# Patient Record
Sex: Male | Born: 1965 | Race: Black or African American | Hispanic: No | Marital: Single | Smoking: Never smoker
Health system: Southern US, Community
[De-identification: ages and names within clinical notes are randomized; demographics above are authoritative.]

## PROBLEM LIST (undated history)

## (undated) DIAGNOSIS — E785 Hyperlipidemia, unspecified: Secondary | ICD-10-CM

## (undated) DIAGNOSIS — K219 Gastro-esophageal reflux disease without esophagitis: Secondary | ICD-10-CM

## (undated) DIAGNOSIS — I10 Essential (primary) hypertension: Secondary | ICD-10-CM

## (undated) HISTORY — DX: Hyperlipidemia, unspecified: E78.5

## (undated) HISTORY — DX: Essential (primary) hypertension: I10

---

## 2005-10-16 ENCOUNTER — Encounter: Admission: RE | Admit: 2005-10-16 | Discharge: 2005-10-16 | Payer: Self-pay | Admitting: Specialist

## 2007-12-21 ENCOUNTER — Emergency Department (HOSPITAL_COMMUNITY): Admission: EM | Admit: 2007-12-21 | Discharge: 2007-12-22 | Payer: Self-pay | Admitting: Emergency Medicine

## 2009-03-27 ENCOUNTER — Encounter: Admission: RE | Admit: 2009-03-27 | Discharge: 2009-03-27 | Payer: Self-pay | Admitting: Family Medicine

## 2009-04-14 ENCOUNTER — Ambulatory Visit (HOSPITAL_COMMUNITY): Admission: RE | Admit: 2009-04-14 | Discharge: 2009-04-14 | Payer: Self-pay | Admitting: Emergency Medicine

## 2010-10-10 ENCOUNTER — Emergency Department (HOSPITAL_COMMUNITY)
Admission: EM | Admit: 2010-10-10 | Discharge: 2010-10-10 | Payer: Self-pay | Source: Home / Self Care | Admitting: Emergency Medicine

## 2010-10-13 LAB — BASIC METABOLIC PANEL
BUN: 14 mg/dL (ref 6–23)
CO2: 26 mEq/L (ref 19–32)
Calcium: 9.8 mg/dL (ref 8.4–10.5)
Chloride: 106 mEq/L (ref 96–112)
Creatinine, Ser: 0.94 mg/dL (ref 0.4–1.5)
GFR calc Af Amer: >60 mL/min (ref >60)
GFR calc non Af Amer: >60 mL/min (ref >60)
Glucose, Bld: 131 mg/dL — ABNORMAL HIGH (ref 70–99)
Potassium: 3.6 mEq/L (ref 3.5–5.1)
Sodium: 140 mEq/L (ref 135–145)

## 2010-10-13 LAB — CBC
HCT: 40.5 % (ref 39.0–52.0)
Hemoglobin: 13.5 g/dL (ref 13.0–17.0)
MCH: 22.4 pg — ABNORMAL LOW (ref 26.0–34.0)
MCHC: 33.3 g/dL (ref 30.0–36.0)
MCV: 67.3 fL — ABNORMAL LOW (ref 78.0–100.0)
Platelets: 134 10*3/uL — ABNORMAL LOW (ref 150–400)
RBC: 6.02 MIL/uL — ABNORMAL HIGH (ref 4.22–5.81)
RDW: 15.8 % — ABNORMAL HIGH (ref 11.5–15.5)
WBC: 5.6 10*3/uL (ref 4.0–10.5)

## 2010-10-13 LAB — D-DIMER, QUANTITATIVE: D-Dimer, Quant: 0.41 ug/mL-FEU (ref 0.00–0.48)

## 2010-12-05 ENCOUNTER — Other Ambulatory Visit: Payer: Self-pay | Admitting: Family Medicine

## 2010-12-05 DIAGNOSIS — E049 Nontoxic goiter, unspecified: Secondary | ICD-10-CM

## 2010-12-10 ENCOUNTER — Other Ambulatory Visit: Payer: Self-pay

## 2010-12-15 ENCOUNTER — Other Ambulatory Visit (HOSPITAL_COMMUNITY): Payer: Self-pay | Admitting: Endocrinology

## 2010-12-15 DIAGNOSIS — E0590 Thyrotoxicosis, unspecified without thyrotoxic crisis or storm: Secondary | ICD-10-CM

## 2010-12-25 ENCOUNTER — Ambulatory Visit (HOSPITAL_COMMUNITY): Payer: Self-pay

## 2010-12-26 ENCOUNTER — Other Ambulatory Visit (HOSPITAL_COMMUNITY): Payer: Self-pay

## 2011-01-05 ENCOUNTER — Ambulatory Visit (HOSPITAL_COMMUNITY): Payer: Self-pay

## 2011-01-06 ENCOUNTER — Other Ambulatory Visit (HOSPITAL_COMMUNITY): Payer: Self-pay

## 2011-01-08 ENCOUNTER — Ambulatory Visit (HOSPITAL_COMMUNITY): Payer: Self-pay

## 2011-01-09 ENCOUNTER — Other Ambulatory Visit (HOSPITAL_COMMUNITY): Payer: Self-pay

## 2011-06-22 LAB — RAPID STREP SCREEN (MED CTR MEBANE ONLY): Streptococcus, Group A Screen (Direct): NEGATIVE

## 2011-09-13 ENCOUNTER — Ambulatory Visit (INDEPENDENT_AMBULATORY_CARE_PROVIDER_SITE_OTHER): Payer: BC Managed Care – PPO

## 2011-09-13 DIAGNOSIS — R071 Chest pain on breathing: Secondary | ICD-10-CM

## 2011-09-17 ENCOUNTER — Ambulatory Visit (INDEPENDENT_AMBULATORY_CARE_PROVIDER_SITE_OTHER): Payer: BC Managed Care – PPO

## 2011-09-17 DIAGNOSIS — J069 Acute upper respiratory infection, unspecified: Secondary | ICD-10-CM

## 2011-09-17 DIAGNOSIS — R05 Cough: Secondary | ICD-10-CM

## 2011-12-15 ENCOUNTER — Ambulatory Visit (INDEPENDENT_AMBULATORY_CARE_PROVIDER_SITE_OTHER): Payer: BC Managed Care – PPO | Admitting: Family Medicine

## 2011-12-15 VITALS — BP 139/90 | HR 67 | Temp 98.5°F | Resp 18 | Ht 72.0 in | Wt 194.0 lb

## 2011-12-15 DIAGNOSIS — K219 Gastro-esophageal reflux disease without esophagitis: Secondary | ICD-10-CM | POA: Insufficient documentation

## 2011-12-15 DIAGNOSIS — J029 Acute pharyngitis, unspecified: Secondary | ICD-10-CM

## 2011-12-15 LAB — POCT RAPID STREP A (OFFICE): Rapid Strep A Screen: NEGATIVE

## 2011-12-15 MED ORDER — IPRATROPIUM BROMIDE 0.03 % NA SOLN: 2.0000 | NASAL | Status: DC | PRN

## 2011-12-15 MED ORDER — AMOXICILLIN 875 MG PO TABS: 875.0000 mg | ORAL_TABLET | Freq: Two times a day (BID) | ORAL | Status: AC

## 2011-12-15 MED ORDER — MAGIC MOUTHWASH W/LIDOCAINE: 5.0000 mL | Freq: Four times a day (QID) | ORAL | Status: DC | PRN

## 2011-12-15 MED ORDER — ESOMEPRAZOLE MAGNESIUM 20 MG PO PACK: 40.0000 mg | PACK | Freq: Every day | ORAL | Status: DC

## 2011-12-15 NOTE — Patient Instructions (Signed)

## 2011-12-15 NOTE — Progress Notes (Signed)
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  O:  NAD Throat: red, no exudates TM's:  Retracted, dull right ear Chest:  Clear Neck:  Tender right submandibular node Results for orders placed in visit on 12/15/11  POCT RAPID STREP A (OFFICE)      Component Value Range   Rapid Strep A Screen Negative  Negative   A: otitis media P:  Amox, mmw

## 2011-12-17 ENCOUNTER — Emergency Department (HOSPITAL_COMMUNITY)
Admission: EM | Admit: 2011-12-17 | Discharge: 2011-12-17 | Disposition: A | Discharge status: Home / Self Care | Payer: BC Managed Care – PPO | Attending: Emergency Medicine | Admitting: Emergency Medicine

## 2011-12-17 ENCOUNTER — Encounter (HOSPITAL_COMMUNITY): Payer: Self-pay

## 2011-12-17 ENCOUNTER — Emergency Department (HOSPITAL_COMMUNITY): Payer: BC Managed Care – PPO

## 2011-12-17 DIAGNOSIS — R070 Pain in throat: Secondary | ICD-10-CM | POA: Insufficient documentation

## 2011-12-17 DIAGNOSIS — Z79899 Other long term (current) drug therapy: Secondary | ICD-10-CM | POA: Insufficient documentation

## 2011-12-17 DIAGNOSIS — R599 Enlarged lymph nodes, unspecified: Secondary | ICD-10-CM | POA: Insufficient documentation

## 2011-12-17 DIAGNOSIS — J029 Acute pharyngitis, unspecified: Secondary | ICD-10-CM

## 2011-12-17 MED ORDER — LIDOCAINE VISCOUS 2 % MT SOLN: 20.0000 mL | OROMUCOSAL | Status: AC | PRN

## 2011-12-17 NOTE — Discharge Instructions (Signed)

## 2011-12-17 NOTE — ED Notes (Signed)
Pt states that it feels like something is stuck in his throat, pt moving air and able to speak

## 2011-12-17 NOTE — ED Provider Notes (Signed)
Medical screening examination/treatment/procedure(s) were performed by non-physician practitioner and as supervising physician I was immediately available for consultation/collaboration.   Lyanne Co, MD 12/17/11 [phone]

## 2011-12-17 NOTE — ED Provider Notes (Signed)
History     CSN: [id number]  Arrival date & time 12/17/11  0610   First MD Initiated Contact with Patient 12/17/11 [phone]      Chief Complaint  Patient presents with  . Foreign Body    (Consider location/radiation/quality/duration/timing/severity/associated sxs/prior treatment) HPI  46 year old male presents with chief complaints of dysphagia. Patient states for the past couple days he has been having discomfort to his throat. Initially he experiencing right ear pain, with associated sneezing, and mild nasal congestion. Ear pain lasting 1 day and has improved.  However, patient states pain continues to travel to throat, with increase difficulty swallowing.  Sxs worsen yesterday but does improve.  He is here because he worries pain may travel down to his neck and caused difficulty breathing.  Denies SOB at this time.  Denies fever, headache, cp, sob, n/v/d, throat swelling.  Denies any medication changes, exposure to foreign body, or having recent trauma.    History reviewed. No pertinent past medical history.  History reviewed. No pertinent past surgical history.  No family history on file.  History  Substance Use Topics  . Smoking status: Never Smoker   . Smokeless tobacco: Not on file  . Alcohol Use: No      Review of Systems  All other systems reviewed and are negative.    Allergies  Chloroquine  Home Medications   Current Outpatient Rx  Name Route Sig Dispense Refill  . MAGIC MOUTHWASH W/LIDOCAINE Oral Take 5 mLs by mouth 4 (four) times daily as needed. 100 mL 0  . AMOXICILLIN 875 MG PO TABS Oral Take 1 tablet (875 mg total) by mouth 2 (two) times daily. 20 tablet 0  . ASPIRIN 81 MG PO TABS Oral Take 81 mg by mouth as needed.    Marland Kitchen ESOMEPRAZOLE MAGNESIUM 20 MG PO PACK Oral Take 40 mg by mouth daily. 30 each 11  . IPRATROPIUM BROMIDE 0.03 % NA SOLN Nasal Place 2 sprays into the nose as needed. 30 mL 11    BP 147/85  Pulse 89  Temp(Src) 98.7 F (37.1 C) (Oral)   Resp 20  SpO2 100%  Physical Exam  Nursing note and vitals reviewed. Constitutional: He appears well-developed and well-nourished. No distress.       Awake, alert, nontoxic appearance  HENT:  Head: Atraumatic.  Right Ear: Hearing and tympanic membrane normal.  Left Ear: Hearing and tympanic membrane normal.  Nose: Nose normal.  Mouth/Throat: Uvula is midline, oropharynx is clear and moist and mucous membranes are normal. No oral lesions. No oropharyngeal exudate, posterior oropharyngeal edema, posterior oropharyngeal erythema or tonsillar abscesses.  Eyes: Conjunctivae are normal. Right eye exhibits no discharge. Left eye exhibits no discharge.  Neck: Normal range of motion and phonation normal. Neck supple. Tracheal tenderness present. No tracheal deviation present. No mass present.  Cardiovascular: Normal rate and regular rhythm.   Pulmonary/Chest: Effort normal. No respiratory distress. He exhibits no tenderness.  Abdominal: Soft. There is no tenderness. There is no rebound.  Musculoskeletal: He exhibits no tenderness.       ROM appears intact, no obvious focal weakness  Lymphadenopathy:    He has cervical adenopathy.  Neurological: He is alert.  Skin: Skin is warm and dry. No rash noted.  Psychiatric: He has a normal mood and affect.    ED Course  Procedures (including critical care time)  Labs Reviewed - No data to display No results found.   No diagnosis found.  Results for orders placed in visit on  12/15/11  POCT RAPID STREP A (OFFICE)      Component Value Range   Rapid Strep A Screen Negative  Negative    Dg Neck Soft Tissue  12/17/2011  *RADIOLOGY REPORT*  Clinical Data: Feeling of something stuck in the throat, difficulty swallowing  NECK SOFT TISSUES - 1+ VIEW  Comparison: None.  Findings: The hypopharynx appears normal.  No opaque foreign body is seen.  The epiglottis is normal in size.  No retropharyngeal soft tissue swelling is seen.  The cervical vertebrae are  in normal alignment.  IMPRESSION: Negative lateral soft tissue view of the neck.  Original Report Authenticated By: Juline Patch, M.D.       MDM  Patient with throat pain.  He worries about inability to swallow. He is however able to tolerate by mouth without difficulty. Oral examination reveals no acute finding. He does have mild anterior cervical lymphadenopathy that is tender on palpation. No evidence of abscess or overlying skin changes. No evidence of trismus. No voice changes. He presents with URI symptoms. He requests for x-ray for further evaluation. I will order neck x-ray here in ED.  Pt also has been prescribed amox since yesterday. Doubt allergic rxn to amox as sxs occurs prior to abx.    9:13 AM Next soft tissue x-ray reveals no acute finding. Reassurance given. Discussed red flag signs with strict follow up instruction.  Will prescribed viscous lidocaine for comfort, and also ENT referral.  Pt voice understanding and agrees with plan.         Fayrene Helper, PA-C 12/17/11 [id number]  Fayrene Helper, PA-C 12/17/11 [phone]

## 2012-01-16 ENCOUNTER — Ambulatory Visit (INDEPENDENT_AMBULATORY_CARE_PROVIDER_SITE_OTHER): Payer: BC Managed Care – PPO | Admitting: Family Medicine

## 2012-01-16 ENCOUNTER — Other Ambulatory Visit: Payer: Self-pay | Admitting: Family Medicine

## 2012-01-16 DIAGNOSIS — R7989 Other specified abnormal findings of blood chemistry: Secondary | ICD-10-CM

## 2012-01-16 DIAGNOSIS — R079 Chest pain, unspecified: Secondary | ICD-10-CM

## 2012-01-16 DIAGNOSIS — E7800 Pure hypercholesterolemia, unspecified: Secondary | ICD-10-CM

## 2012-01-16 LAB — LIPID PANEL
Cholesterol: 247 mg/dL — ABNORMAL HIGH (ref 0–200)
HDL: 66 mg/dL (ref >39)
LDL Cholesterol: 173 mg/dL — ABNORMAL HIGH (ref 0–99)
Total CHOL/HDL Ratio: 3.7 Ratio
Triglycerides: 38 mg/dL (ref <150)
VLDL: 8 mg/dL (ref 0–40)

## 2012-01-16 LAB — TSH: TSH: 0.115 u[IU]/mL — ABNORMAL LOW (ref 0.350–4.500)

## 2012-01-16 NOTE — Patient Instructions (Signed)
Your cardiologist Dr. Yates [phone]

## 2012-01-16 NOTE — Progress Notes (Signed)
Patient Name: Donald Wyatt Date of Birth: [birth year] Medical Record Number: [id number] Gender: male Date of Encounter: 01/16/2012  History of Present Illness:  Donald Wyatt is a 46 y.o. very pleasant male patient who presents with the following:  Here to discuss recurrent CP.  He has had left sided CP for about the past week.  He has been taking one baby asa daily and notes that after he takes this medication he feels better.  However last night he noted a "sharp" CP in his left side/ chest/ back.  No SOB.  Donald Wyatt has been seen a few times for similar CP and actually has seen cardiology and had a negative ETT about one year ago.  He is not feeling the pain now.  He took a baby asa this morning around 2 am which relieved the pain.   The chest pain lasts for 30- 40 seconds before resolving.  It occurs usually once a day.  It usually occurs during the day but also occurred last night.  He is usually sitting or walking when the pain occurs.   Looking back through our chart Donald Wyatt has been seen at least 8 times for CP over the last couple of years.    See negative ETT 12/10/2010   Donald Wyatt also notes that he suffers from GERD and eats only once a day- he usually eats "just one big meal" daily in the afternoon.   Patient Active Problem List  Diagnoses  . GERD (gastroesophageal reflux disease)   No past medical history on file. No past surgical history on file. History  Substance Use Topics  . Smoking status: Never Smoker   . Smokeless tobacco: Not on file  . Alcohol Use: No   No family history on file. Allergies  Allergen Reactions  . Chloroquine Itching    Medication list has been reviewed and updated.  Review of Systems: As per HPI- otherwise negative.   Physical Examination: Filed Vitals:   01/16/12 0924  BP: 132/85  Pulse: 76  Temp: 98.8 F (37.1 C)  TempSrc: Oral  Resp: 16  Height: 6' (1.829 m)  Weight: 194 lb (87.998 kg)   O2 sat 98% Body mass index is 26.31  kg/(m^2).  GEN: WDWN, NAD, Non-toxic, A & O x 3 HEENT: Atraumatic, Normocephalic. Neck supple. No masses, No LAD.  TM, oropharynx wnl Ears and Nose: No external deformity. CV: RRR, No M/G/R. No JVD. No thrill. No extra heart sounds. PULM: CTA B, no wheezes, crackles, rhonchi. No retractions. No resp. distress. No accessory muscle use. ABD: S, NT, ND, +BS. No rebound. No HSM. EXTR: No c/c/e NEURO Normal gait.  PSYCH: Normally interactive. Conversant. Not depressed or anxious appearing.  Calm demeanor.   EKG: normal sinus rhythm, there is a small downward going T in III but this is not new.  Assessment and Plan: 1. Chest pain  EKG 12-Lead  2. High cholesterol  Lipid panel  3. Abnormal TSH  TSH   Donald Wyatt is here to evaluate very atypical CP- he has been in to clinic several times for this and he tends to be very anxious about his CP.  Tried to reassure him that a normal stress test is reassuring for several years after the test.  Will check his lipids again, and TSH.  Reassured him that I do not think that his pain is cardiac.  Offered a recheck with cardiology and gave him Dr. Verl Dicker number- he can recheck at his office if he is still  worried or if his pain persists or worsens.  If he has typical pain (occurs with exercise, lasts several minutes, is associated with sweating or nausea) seek care at ED

## 2012-01-18 LAB — T3: T3, Total: 102.9 ng/dL (ref 80.0–204.0)

## 2012-01-18 LAB — T4, FREE: Free T4: 1.15 ng/dL (ref 0.80–1.80)

## 2012-01-20 ENCOUNTER — Telehealth: Payer: Self-pay

## 2012-01-20 DIAGNOSIS — E7800 Pure hypercholesterolemia, unspecified: Secondary | ICD-10-CM

## 2012-01-20 NOTE — Telephone Encounter (Signed)
Pt is returning clinical nurse phone call he can be reached at [phone]

## 2012-01-21 ENCOUNTER — Encounter: Payer: Self-pay | Admitting: Family Medicine

## 2012-01-21 NOTE — Progress Notes (Signed)
Addended by: Abbe Amsterdam C on: 01/21/2012 02:06 PM   Modules accepted: Orders

## 2012-01-23 NOTE — Telephone Encounter (Signed)
Pt notified of labs. Would like to try chol med if you can send it to [facility] on [address]. Also pt is no longer having chest complaints.

## 2012-01-24 MED ORDER — PRAVASTATIN SODIUM 40 MG PO TABS: 40.0000 mg | ORAL_TABLET | Freq: Every day | ORAL | Status: DC

## 2012-02-21 ENCOUNTER — Telehealth: Payer: Self-pay

## 2012-02-21 NOTE — Telephone Encounter (Signed)
Pt called stated he left paper work over 5 days  To be filled out. Please call pt when ready to  Pick  Up. Needed before June 3rd. Home # [id number] Cell # [phone]

## 2012-02-22 ENCOUNTER — Telehealth: Payer: Self-pay | Admitting: Family Medicine

## 2012-02-22 NOTE — Telephone Encounter (Signed)
LMOM advising patient to RTC for exam.

## 2012-02-22 NOTE — Telephone Encounter (Signed)
Message copied by Jacqualyn Posey on Mon Feb 22, 2012 10:39 AM ------      Message from: Abbe Amsterdam C      Created: Wed Feb 17, 2012  8:39 PM       Received notes for a physical exam form.  I did not do a CPE in April- we actually saw him for chest pain!  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

## 2012-02-22 NOTE — Telephone Encounter (Signed)
See other message, patient needs OV per Dr. Patsy Lager.  Called patient and LMOM advising this.

## 2012-02-23 NOTE — Telephone Encounter (Signed)
PE was October 4th, 2012, which is almost 8 months ago.  I will fill out form based on that PE if that is allowable by the entity needing the exam (i.e. It may state the exam needed to be completed within last 6 months).  If there is no such restriction, I will fill it out.  But bring it soon before I leave!

## 2012-02-23 NOTE — Telephone Encounter (Signed)
Pt CB and was confused and wanted [place] to check on when last CPE was done. He knows that he didn't have PE in April, but thought it was w/in the last 6 mos and wondered if PPW could be filled out from that PE? Dr Georgiana Shore, can you do this from that PE, or does pt need to RTC for an OV (but not a CPE since his ins won't cover yet)? Chart is in your box MR [id number]

## 2012-02-25 NOTE — Telephone Encounter (Signed)
LMOM on Cell # to CB. Tried his H # and child answered and said he was not there. I did not LM w/child

## 2012-02-26 ENCOUNTER — Telehealth: Payer: Self-pay

## 2012-02-26 NOTE — Telephone Encounter (Signed)
Pt called in to stay that it was ok to use pe from ago he is needing this info before Monday, please contact pt when ready for pick up.

## 2012-02-26 NOTE — Telephone Encounter (Signed)
LMOM that Dr Georgiana Shore stated that she will fill out form using info from PE 8 mos ago if his employer allows it to be from that long ago. Advised that pt bring form by ASAP since Dr Georgiana Shore is moving very soon.

## 2012-02-27 NOTE — Telephone Encounter (Signed)
Their are notes in message box from 5-27 thur 6-31 pt has been trying to get ppwork filled out for court purpose or for employment he is very upset and states he will come in tomorrow and stay till he gets his paperwork.. Please contact pt when ppw is ready to be picked-up

## 2012-02-28 ENCOUNTER — Ambulatory Visit (INDEPENDENT_AMBULATORY_CARE_PROVIDER_SITE_OTHER): Payer: Self-pay | Admitting: Internal Medicine

## 2012-02-28 VITALS — BP 118/75 | HR 68 | Temp 98.1°F | Resp 16 | Ht 71.5 in | Wt 191.0 lb

## 2012-02-28 DIAGNOSIS — Z0289 Encounter for other administrative examinations: Secondary | ICD-10-CM

## 2012-02-28 DIAGNOSIS — Z029 Encounter for administrative examinations, unspecified: Secondary | ICD-10-CM

## 2012-02-28 NOTE — Telephone Encounter (Signed)
Pt came in today to pickup PE form. We could not locate form. Pt checked in to be seen to have PE done today as he has the test at 7:30 am Monday.

## 2012-02-28 NOTE — Progress Notes (Signed)
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

## 2012-03-25 ENCOUNTER — Ambulatory Visit (INDEPENDENT_AMBULATORY_CARE_PROVIDER_SITE_OTHER): Payer: BC Managed Care – PPO | Admitting: Family Medicine

## 2012-03-25 ENCOUNTER — Other Ambulatory Visit: Payer: Self-pay | Admitting: Family Medicine

## 2012-03-25 ENCOUNTER — Encounter: Payer: Self-pay | Admitting: Family Medicine

## 2012-03-25 VITALS — BP 123/83 | HR 63 | Temp 98.2°F | Resp 18 | Ht 71.5 in | Wt 195.0 lb

## 2012-03-25 DIAGNOSIS — M545 Low back pain, unspecified: Secondary | ICD-10-CM

## 2012-03-25 DIAGNOSIS — S39012A Strain of muscle, fascia and tendon of lower back, initial encounter: Secondary | ICD-10-CM

## 2012-03-25 MED ORDER — CYCLOBENZAPRINE HCL 10 MG PO TABS: ORAL_TABLET | ORAL | Status: DC

## 2012-03-25 MED ORDER — OXAPROZIN 600 MG PO TABS: ORAL_TABLET | ORAL | Status: DC

## 2012-03-25 NOTE — Patient Instructions (Signed)
Take the muscle relaxant at bedtime. I advised taking the anti-inflammatory medicine (Daypro) twice daily for a few days. Return if problems.
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
## 2012-07-12 ENCOUNTER — Emergency Department (HOSPITAL_COMMUNITY)
Admission: EM | Admit: 2012-07-12 | Discharge: 2012-07-12 | Disposition: A | Discharge status: Nursing Home (Includes ICF) | Payer: BC Managed Care – PPO | Source: Home / Self Care | Attending: Emergency Medicine | Admitting: Emergency Medicine

## 2012-07-12 ENCOUNTER — Emergency Department (HOSPITAL_COMMUNITY): Payer: BC Managed Care – PPO

## 2012-07-12 ENCOUNTER — Encounter (HOSPITAL_COMMUNITY): Payer: Self-pay | Admitting: *Deleted

## 2012-07-12 ENCOUNTER — Encounter (HOSPITAL_COMMUNITY): Payer: Self-pay | Admitting: Radiology

## 2012-07-12 ENCOUNTER — Observation Stay (HOSPITAL_COMMUNITY)
Admission: EM | Admit: 2012-07-12 | Discharge: 2012-07-13 | Disposition: A | Discharge status: Home / Self Care | Payer: BC Managed Care – PPO | Attending: Family Medicine | Admitting: Family Medicine

## 2012-07-12 DIAGNOSIS — R0789 Other chest pain: Secondary | ICD-10-CM

## 2012-07-12 DIAGNOSIS — Z23 Encounter for immunization: Secondary | ICD-10-CM | POA: Insufficient documentation

## 2012-07-12 DIAGNOSIS — R079 Chest pain, unspecified: Secondary | ICD-10-CM

## 2012-07-12 DIAGNOSIS — D649 Anemia, unspecified: Secondary | ICD-10-CM

## 2012-07-12 DIAGNOSIS — R9431 Abnormal electrocardiogram [ECG] [EKG]: Secondary | ICD-10-CM | POA: Insufficient documentation

## 2012-07-12 DIAGNOSIS — Z7982 Long term (current) use of aspirin: Secondary | ICD-10-CM | POA: Insufficient documentation

## 2012-07-12 DIAGNOSIS — E0590 Thyrotoxicosis, unspecified without thyrotoxic crisis or storm: Secondary | ICD-10-CM | POA: Insufficient documentation

## 2012-07-12 DIAGNOSIS — D509 Iron deficiency anemia, unspecified: Secondary | ICD-10-CM | POA: Insufficient documentation

## 2012-07-12 DIAGNOSIS — K219 Gastro-esophageal reflux disease without esophagitis: Secondary | ICD-10-CM | POA: Diagnosis present

## 2012-07-12 HISTORY — DX: Gastro-esophageal reflux disease without esophagitis: K21.9

## 2012-07-12 LAB — TROPONIN I: Troponin I: <0.3 ng/mL (ref <0.30)

## 2012-07-12 LAB — CBC WITH DIFFERENTIAL/PLATELET
Basophils Relative: 1 % (ref 0–1)
Eosinophils Relative: 2 % (ref 0–5)
HCT: 37.9 % — ABNORMAL LOW (ref 39.0–52.0)
Hemoglobin: 12.9 g/dL — ABNORMAL LOW (ref 13.0–17.0)
Lymphs Abs: 1.3 10*3/uL (ref 0.7–4.0)
MCH: 23.3 pg — ABNORMAL LOW (ref 26.0–34.0)
MCV: 68.5 fL — ABNORMAL LOW (ref 78.0–100.0)
Monocytes Absolute: 0.4 10*3/uL (ref 0.1–1.0)
Neutrophils Relative %: 70 % (ref 43–77)
RBC: 5.53 MIL/uL (ref 4.22–5.81)

## 2012-07-12 LAB — CREATININE, SERUM: GFR calc non Af Amer: 87 mL/min — ABNORMAL LOW (ref >90)

## 2012-07-12 LAB — CBC
HCT: 38.7 % — ABNORMAL LOW (ref 39.0–52.0)
MCH: 22.9 pg — ABNORMAL LOW (ref 26.0–34.0)
MCHC: 33.9 g/dL (ref 30.0–36.0)
MCV: 67.5 fL — ABNORMAL LOW (ref 78.0–100.0)
RDW: 16 % — ABNORMAL HIGH (ref 11.5–15.5)

## 2012-07-12 LAB — BASIC METABOLIC PANEL
BUN: 11 mg/dL (ref 6–23)
Chloride: 105 mEq/L (ref 96–112)
GFR calc Af Amer: >90 mL/min (ref >90)
Potassium: 3.7 mEq/L (ref 3.5–5.1)

## 2012-07-12 LAB — POCT I-STAT TROPONIN I: Troponin i, poc: 0 ng/mL (ref 0.00–0.08)

## 2012-07-12 MED ORDER — PANTOPRAZOLE SODIUM 40 MG PO TBEC
80.0000 mg | DELAYED_RELEASE_TABLET | Freq: Every day | ORAL | Status: DC
  Administered 2012-07-12 – 2012-07-13 (×2): 80 mg via ORAL
  Filled 2012-07-12 (×2): qty 2

## 2012-07-12 MED ORDER — ASPIRIN 81 MG PO CHEW
CHEWABLE_TABLET | ORAL | Status: AC
  Filled 2012-07-12: qty 4

## 2012-07-12 MED ORDER — ONDANSETRON HCL 4 MG/2ML IJ SOLN: 4.0000 mg | Freq: Four times a day (QID) | INTRAMUSCULAR | Status: DC | PRN

## 2012-07-12 MED ORDER — SODIUM CHLORIDE 0.9 % IV SOLN: 250.0000 mL | INTRAVENOUS | Status: DC | PRN

## 2012-07-12 MED ORDER — SODIUM CHLORIDE 0.9 % IJ SOLN
3.0000 mL | Freq: Two times a day (BID) | INTRAMUSCULAR | Status: DC
  Administered 2012-07-13: 3 mL via INTRAVENOUS

## 2012-07-12 MED ORDER — SODIUM CHLORIDE 0.9 % IV SOLN
INTRAVENOUS | Status: AC
  Administered 2012-07-12: 21:00:00 via INTRAVENOUS

## 2012-07-12 MED ORDER — NITROGLYCERIN 0.4 MG SL SUBL: 0.4000 mg | SUBLINGUAL_TABLET | SUBLINGUAL | Status: DC | PRN

## 2012-07-12 MED ORDER — ACETAMINOPHEN 325 MG PO TABS: 650.0000 mg | ORAL_TABLET | ORAL | Status: DC | PRN

## 2012-07-12 MED ORDER — ONDANSETRON HCL 4 MG/2ML IJ SOLN: 4.0000 mg | Freq: Three times a day (TID) | INTRAMUSCULAR | Status: DC | PRN

## 2012-07-12 MED ORDER — ASPIRIN 300 MG RE SUPP
300.0000 mg | RECTAL | Status: DC
  Filled 2012-07-12: qty 1

## 2012-07-12 MED ORDER — SODIUM CHLORIDE 0.9 % IJ SOLN: 3.0000 mL | INTRAMUSCULAR | Status: DC | PRN

## 2012-07-12 MED ORDER — SODIUM CHLORIDE 0.9 % IV SOLN
INTRAVENOUS | Status: DC
  Administered 2012-07-12 (×3): via INTRAVENOUS

## 2012-07-12 MED ORDER — NITROGLYCERIN 0.4 MG SL SUBL
0.4000 mg | SUBLINGUAL_TABLET | SUBLINGUAL | Status: DC | PRN
  Filled 2012-07-12: qty 25

## 2012-07-12 MED ORDER — ASPIRIN 81 MG PO CHEW: 324.0000 mg | CHEWABLE_TABLET | ORAL | Status: DC

## 2012-07-12 MED ORDER — ASPIRIN 81 MG PO CHEW
324.0000 mg | CHEWABLE_TABLET | Freq: Once | ORAL | Status: AC
  Administered 2012-07-12: 324 mg via ORAL

## 2012-07-12 MED ORDER — HEPARIN SODIUM (PORCINE) 5000 UNIT/ML IJ SOLN
5000.0000 [IU] | Freq: Three times a day (TID) | INTRAMUSCULAR | Status: DC
  Administered 2012-07-12 – 2012-07-13 (×2): 5000 [IU] via SUBCUTANEOUS
  Filled 2012-07-12 (×5): qty 1

## 2012-07-12 MED ORDER — ASPIRIN EC 81 MG PO TBEC
81.0000 mg | DELAYED_RELEASE_TABLET | Freq: Every day | ORAL | Status: DC
  Filled 2012-07-12: qty 1

## 2012-07-12 MED ORDER — SODIUM CHLORIDE 0.9 % IV SOLN
INTRAVENOUS | Status: DC
  Administered 2012-07-13: 04:00:00 via INTRAVENOUS

## 2012-07-12 NOTE — Consult Note (Addendum)
CARDIOLOGY CONSULT NOTE  Patient ID: Donald Wyatt MRN: [id number] DOB/AGE: 1966/09/12 46 y.o.  Admit date: 07/12/2012 Referring Physician [facility] Primary Physician: No primary provider on file. Reason for Consultation Chest pain and abnormal EKG  HPI: Patient history 46 year old [place] American male with no significant prior cardiovascular history except for chest pain and has had multiple outpatient evaluation for this face.  I have seen him in March of 2012 in which he underwent exercise stress testing and essentially did well without any EKG abnormality on a Bruce protocol stress test.  He had seen urgent medical and family medicine physicians at least 3-4 times for chest discomfort.  He then presented today to the emergency room complaining of chest pain.  He was brought via EMS.  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  He was flown similar to this in which reduced his pain better, who felt palpitations at that time. Chest pain is described as a pressure-like sensation and comes with a without exertional activity and lasts for several minutes and relieved spontaneously.  It is described as mild and today.  Last 3-4 days and been having frequent chest discomfort and today was the worst and it was not easy going away.  He had taken aspirin previously with relief.  Due to persistence of chest discomfort he's presenting to the emergency room. When I saw him this evening around 7 PM, patient is having dinner and states that he is feeling better but feels some discomfort.  On further questioning he states the chest discomfort is present when he moves certain way but also thinks that this could be related to his eating habits and acid reflux.  He is to run and jog regularly, however lately due to scheduling, he is reduced activity in the last 6 months.  He denies any shortness of breath, per patient's, PND or orthopnea.  He denies any symptoms of TIA or dizziness or syncope.  He used  TO BE ON A PPI, but has stopped taking it due to the cost.   History reviewed. No pertinent past medical history.  No history of hypertension, diabetes.  His lipids were elevated 2 months ago. History reviewed. No pertinent past surgical history.   History reviewed. No pertinent family history. No family history of premature coronary artery disease or sudden cardiac death.  Social History: History   Social History  . Marital Status: Single    Spouse Name: N/A    Number of Children: N/A  . Years of Education: N/A   Occupational History  . Not on file.   Social History Main Topics  . Smoking status: Never Smoker   . Smokeless tobacco: Not on file  . Alcohol Use: No  . Drug Use: Not on file  . Sexually Active: Not on file   Other Topics Concern  . Not on file   Social History Narrative  . No narrative on file     Prescriptions prior to admission  Medication Sig Dispense Refill  . aspirin 81 MG tablet Take 81 mg by mouth as needed. For pain        ROS: General: no fevers/chills/night sweats Eyes: no blurry vision, diplopia, or amaurosis ENT: no sore throat or hearing loss Resp: no cough, wheezing, or hemoptysis CV: no edema or palpitations GI: no abdominal pain, nausea, vomiting, diarrhea, or constipation GU: no dysuria, frequency, or hematuria Skin: no rash Neuro: no headache, numbness, tingling, or weakness of extremities Musculoskeletal: no joint  pain or swelling Heme: no bleeding, DVT, or easy bruising Endo: no polydipsia or polyuria    Physical Exam: Blood pressure 142/72, pulse 84, temperature 98.8 F (37.1 C), temperature source Oral, resp. rate 16, height 6' (1.829 m), weight 85.2 kg (187 lb 13.3 oz), SpO2 100.00%.   Physical Examination: General appearance - alert, well appearing, and in no distress Chest - clear to auscultation, no wheezes, rales or rhonchi, symmetric air entry Heart - normal rate, regular rhythm, normal S1, S2, no murmurs, rubs,  clicks or gallops Abdomen - soft, nontender, nondistended, no masses or organomegaly Neurological - alert, oriented, normal speech, no focal findings or movement disorder noted Musculoskeletal - no joint tenderness, deformity or swelling. FROM. No edema.   Labs:   Lab Results  Component Value Date   WBC 6.9 07/12/2012   HGB 13.1 07/12/2012   HCT 38.7* 07/12/2012   MCV 67.5* 07/12/2012   PLT 167 07/12/2012    Lab 07/12/12 2018 07/12/12 1424  NA -- 139  K -- 3.7  CL -- 105  CO2 -- 26  BUN -- 11  CREATININE 1.02 --  CALCIUM -- 9.6  PROT -- --  BILITOT -- --  ALKPHOS -- --  ALT -- --  AST -- --  GLUCOSE -- 107*   Lab Results  Component Value Date   TROPONINI <0.30 07/12/2012    Lipid Panel     Component Value Date/Time   CHOL 247* 01/16/2012 1030   TRIG 38 01/16/2012 1030   HDL 66 01/16/2012 1030   CHOLHDL 3.7 01/16/2012 1030   VLDL 8 01/16/2012 1030   LDLCALC 173* 01/16/2012 1030    Lipid Panel     Component Value Date/Time   CHOL 216* 07/13/2012 0216   TRIG 44 07/13/2012 0216   HDL 76 07/13/2012 0216   CHOLHDL 2.8 07/13/2012 0216   VLDL 9 07/13/2012 0216   LDLCALC 131* 07/13/2012 0216       Radiology: Dg Chest 2 View  07/12/2012  *RADIOLOGY REPORT*  Clinical Data: Chest pain.  CHEST - 2 VIEW  Comparison: 10/10/2010.  Findings: The cardiac silhouette, mediastinal and hilar contours are normal and stable.  The lungs are clear.  No pleural effusion. The bony thorax is intact.  IMPRESSION: No acute cardiopulmonary findings.   Original Report Authenticated By: P. Loralie Champagne, M.D.     WNU:UVOZDG sinus rhythm with rate of 77 min., normal axis.  Nonspecific diffuse T wave inversion noted in anterolateral leads.  Normal QT interval.  Compared to the EKG done in May of 2013, no significant change.  ASSESSMENT AND PLAN:  1. Atypical chest pain, cannot completely exclude underlying coronary artery disease.His only risk factor appears to be hyperlipidemia. Chest  pain is very atypical qualities (probably related to GI etiology). 2.  Abnormal CBC revealing microcytosis, toxic granules and large platelets. 3.  Hyperlipidemia.  Recommendations: This specimen was treated with sublingual nitroglycerin while being transferred via EMS.  Chest pain has very atypical quality for unstable angina, patient symptoms occurring both at rest and with minimal exertion activity.  Overall he is a low-risk patient, however he is already has had a routine treadmill exercise test as a year and a half ago and he has had multiple office visits for recurrent chest pain and he does have hyperlipidemia.  Given these abnormalities, I will discuss this patient regarding repeating stress test versus treating him for GI etiology or proceeding with cardiac catheterization for definitive diagnosis of coronary artery.  Patient  understands less than 1% risk of death, stroke, MI, need for urgent CABG, bleeding, infection but not limited to this. Unless his lab work comes back significantly abnormal, I plan on performing cardiac catheterization in the morning.  He does need therapy for hyperlipidemia.  I do agree with repeating his lipids.  I'm not very convinced that this is truly unstable angina pectoris Hence I have not started him on any anticoagulants.  Will continue to watch his cardiac markers.  Pamella Pert, MD 07/12/2012, 9:56 PM  I have evaluated his morning EKG and also  The cardiac enzymes. There is no change and also his lipids althoug mildly elevated show Non HDL of 140 which is minimally elevated. Given a normal stress a year ago and symptoms more suggestive of GI etiology, first treat with PPI and if chest pain is recurrent then consider cardiac cath as out patient basis. Previously LDL reported at 173.  I will be available to discuss on my pager or cell. [phone]

## 2012-07-12 NOTE — ED Provider Notes (Signed)
History     CSN: [id number]  Arrival date & time 07/12/12  1242   First MD Initiated Contact with Patient 07/12/12 1308      Chief Complaint  Patient presents with  . Chest Pain    (Consider location/radiation/quality/duration/timing/severity/associated sxs/prior treatment) Patient is a 46 y.o. male presenting with chest pain. The history is provided by the patient.  Chest Pain The chest pain began 3 - 5 hours ago. Chest pain occurs constantly. The chest pain is improving. At its most intense, the pain is at 6/10. The pain is currently at 3/10. The severity of the pain is moderate. The quality of the pain is described as pressure-like and sharp. The pain does not radiate. Pertinent negatives for primary symptoms include no fever, no shortness of breath, no cough, no palpitations, no abdominal pain, no nausea, no vomiting and no dizziness.  Pertinent negatives for associated symptoms include no diaphoresis, no lower extremity edema, no near-syncope, no numbness, no orthopnea and no weakness.   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 Pt reports improvement with aspirin and SL Nitro that he received at [facility]. Stats pain now is minimal. PT stats he has had similar symptoms in the past. Negative stress test a year ago. States no associated symptoms, no SOB, Nausea, dizziness, diaphoresis. No family hx or personal hx of cardiac problems.   History reviewed. No pertinent past medical history.  History reviewed. No pertinent past surgical history.  History reviewed. No pertinent family history.  History  Substance Use Topics  . Smoking status: Never Smoker   . Smokeless tobacco: Not on file  . Alcohol Use: No      Review of Systems  Constitutional: Negative for fever, chills and diaphoresis.  HENT: Negative for neck pain and neck stiffness.   Respiratory: Positive for chest tightness. Negative for cough and shortness of breath.   Cardiovascular: Positive for  chest pain. Negative for palpitations, orthopnea, leg swelling and near-syncope.  Gastrointestinal: Negative for nausea, vomiting and abdominal pain.  Genitourinary: Negative for dysuria.  Musculoskeletal: Negative for back pain.  Skin: Negative.   Neurological: Negative for dizziness, weakness, numbness and headaches.  Hematological: Negative.     Allergies  Chloroquine  Home Medications   Current Outpatient Rx  Name Route Sig Dispense Refill  . ASPIRIN 81 MG PO TABS Oral Take 81 mg by mouth as needed. For pain      BP 140/86  Pulse 69  Temp 98.2 F (36.8 C) (Oral)  Resp 18  SpO2 100%  Physical Exam  Nursing note and vitals reviewed. Constitutional: He appears well-developed and well-nourished. No distress.  HENT:  Head: Normocephalic.  Eyes: Pupils are equal, round, and reactive to light.  Neck: Neck supple.  Cardiovascular: Normal rate, regular rhythm and normal heart sounds.   Pulmonary/Chest: Effort normal and breath sounds normal. No respiratory distress. He has no wheezes. He has no rales. He exhibits no tenderness.  Abdominal: Soft. Bowel sounds are normal. He exhibits no distension. There is no tenderness. There is no rebound.  Musculoskeletal: He exhibits no edema.  Neurological: He is alert.  Skin: Skin is warm and dry.  Psychiatric: He has a normal mood and affect.    ED Course  Procedures (including critical care time)  Pt with chest pain, improving. ECG with flatening/inverted  T waves which is new from ECG on 10/10/10. Will get cxr, labs, troponin. Will monitor.   Filed Vitals:   07/12/12 1254  BP:  140/86  Pulse: 69  Temp: 98.2 F (36.8 C)  Resp: 18   Results for orders placed during the hospital encounter of 07/12/12  CBC WITH DIFFERENTIAL      Component Value Range   WBC 6.0  4.0 - 10.5 K/uL   RBC 5.53  4.22 - 5.81 MIL/uL   Hemoglobin 12.9 (*) 13.0 - 17.0 g/dL   HCT 16.1 (*) 09.6 - 04.5 %   MCV 68.5 (*) 78.0 - 100.0 fL   MCH 23.3 (*) 26.0  - 34.0 pg   MCHC 34.0  30.0 - 36.0 g/dL   RDW 40.9 (*) 81.1 - 91.4 %   Platelets 160  150 - 400 K/uL   Neutrophils Relative 70  43 - 77 %   Lymphocytes Relative 21  12 - 46 %   Monocytes Relative 6  3 - 12 %   Eosinophils Relative 2  0 - 5 %   Basophils Relative 1  0 - 1 %   Neutro Abs 4.1  1.7 - 7.7 K/uL   Lymphs Abs 1.3  0.7 - 4.0 K/uL   Monocytes Absolute 0.4  0.1 - 1.0 K/uL   Eosinophils Absolute 0.1  0.0 - 0.7 K/uL   Basophils Absolute 0.1  0.0 - 0.1 K/uL   RBC Morphology POLYCHROMASIA PRESENT     WBC Morphology TOXIC GRANULATION     Smear Review LARGE PLATELETS PRESENT    BASIC METABOLIC PANEL      Component Value Range   Sodium 139  135 - 145 mEq/L   Potassium 3.7  3.5 - 5.1 mEq/L   Chloride 105  96 - 112 mEq/L   CO2 26  19 - 32 mEq/L   Glucose, Bld 107 (*) 70 - 99 mg/dL   BUN 11  6 - 23 mg/dL   Creatinine, Ser 7.82  0.50 - 1.35 mg/dL   Calcium 9.6  8.4 - 95.6 mg/dL   GFR calc non Af Amer 86 (*) >90 mL/min   GFR calc Af Amer >90  >90 mL/min  POCT I-STAT TROPONIN I      Component Value Range   Troponin i, poc 0.00  0.00 - 0.08 ng/mL   Comment 3            Dg Chest 2 View  07/12/2012  *RADIOLOGY REPORT*  Clinical Data: Chest pain.  CHEST - 2 VIEW  Comparison: 10/10/2010.  Findings: The cardiac silhouette, mediastinal and hilar contours are normal and stable.  The lungs are clear.  No pleural effusion. The bony thorax is intact.  IMPRESSION: No acute cardiopulmonary findings.   Original Report Authenticated By: P. Loralie Champagne, M.D.     3:54 PM Negative work up so far. Will admit for further evaluation and treatment of chest pain due to new ECG changes. Pt is pain free. Vs normal.   Filed Vitals:   07/12/12 1254  BP: 140/86  Pulse: 69  Temp: 98.2 F (36.8 C)  Resp: 18     1. Chest pain   2. ECG abnormal       MDM          Lottie Mussel, PA 07/12/12 1555

## 2012-07-12 NOTE — H&P (Signed)
Family Medicine Teaching [facility] Admission History and Physical Service Pager: [phone]  Patient name: Donald Wyatt Medical record number: [id number] Date of birth: [birth year] Age: 46 y.o. Gender: male  Primary Care Provider: No primary provider on file.  Chief Complaint: Chest Pain  Assessment and Plan: [name] is a 46 y.o. year old male presenting with atypical chest pain.   1. Atypical Chest Pain - Pt EKG concerning for TWI in II V1 V3 V4 that looks unchanged from previous EKG 1. Pt has had similar episode a year ago in which he was seen by Dr. [name] for workup.  He felt that this was not concerning and told pt he needed to exercise per patient. Patient also has a normal exercise stress in early 2012 which is reassuring.  1. Spoke with Dr. [name] by phone about inverted t waves. These are unchanged from May 2012 but new to Dr. [name], due to change, he plans to come by as a formal consult in house.  2. TIMI score of 1(ASA use in past 7 days) , CAD RF include age.  Otherwise no FMHx, non-smoker, no Hx of HTN, and HDL > 68.  Total score is 0 and Framingham 10 yr Risk is 5 %. 3. First set of Troponin's negative.  Will cycle x 3.  4. Will Risk Stratify.  Last Lipid in April showed TC of 247, LDL of 173.  Repeat Lipid, A1C, and TSH 5. CXR negative, non tachycardic, saturations >98% on RA, low likelihood of PE with Well's Score of 0. 6. Continue to monitor Vitals. Telemetry overnight.  Nitro and Morphine PRN. Continue ASA  7. Could be related to GERD.  Previously on Nexium and d/c due to cost. Patient also reports he was on Zantac and has also stopped this medicine and that pain has been worse since stopping. Could consider restarting PPI.  Could be Stress related, does have increased stress from recent suicide in family (07/11/12).  Also MSK could be origin as TTP around 3rd IC on L.   2. Subclinical Hyperthyroidism - TSH in April 0.115 1. Normal Free T4/T3.  Will repeat   3. GERD - Pt feels has worsened, Consider starting PPI.  4. Anemia - Decreased Hgb to 12.9.  MCV of 68.5 1. Anemia Panel and Hemoccult 2. Consider Colonoscopy as pt does not have source of bleed.  5. FEN/GI: Saline Lock. Regular Diet  6. Prophylaxis: Heparin SQ 7. Disposition: Place in observation.  Telemetry and if troponin's negative x 3, can be f/u in outpt.  8. Code Status: Full  History of Present Illness: Donald Wyatt is a 46 y.o. year old male presenting with Atypical Chest Pain.   Today patient had 6/10 sharp pain to the left of sternum. Chest pain lasts 1-2 minutes then goes away completely then comes back in about 30 minutes. At urgent care, pain persisteted. This happened several rounds today. He was at work sitting and teaching students, not exerting himself at all and not particularly stressed. Patient waited until class was over then signed out from school. No shortness of breath. No nausea, vomiting, fever, chills, cough, leg pain. Some loose stools. No pain down arm or in neck, headache, diaphoresis, URI symptoms. Denies recent stressors with exception of family friend with suicide recently and have been consoling family member yesterday 11pm to 2 am. Was seen at [facility] today and reports that EKG slightly different. Received Nitro and sates within 5 minutes pain went away  but as mentioned before, pain tends to come and go anyway.   Has been having chest pain similar to this on and off for the last year (records reveal patient has actually been having pain for at least 2 years with approximately 10 visits to [place] during this time). Patient states pain is worse specifically when sleeps at night as it feels like his left chest is pulling apart. On and off for last year. Takes baby aspirin typically and relieves it. Started up off and on last week, getting worse up until today.  Also has occasional burning which he attributes to GERD which he has had for the last few years,  was better when he was taking medications. Twisting and turning seems to make the pain the worst. Able to run regularly throughout this past year without getting chest pain. No chest pain with walking or exerting himself today.   Has had a stress test with Dr. Charleston Poot reportedly in 2012 per [place] records which was normal.   Patient Active Problem List  Diagnosis  . GERD (gastroesophageal reflux disease)   Past Medical History: Denies high blood pressure Hyperlipidemia (diet/exercise controlled only) Nonsmoker  Past Surgical History: None  Social History: History  Substance Use Topics  . Smoking status: Never Smoker, no other drugs  . Smokeless tobacco: Not on file  . Alcohol Use: Occasional alcoholic beverage   For any additional social history documentation, please refer to relevant sections of EMR.  Family History: Arthritis No history of CAD in family  Meds:  asprin 81 daily Noni juice intermittently Multivitamin daily Previously on Nexium and Zantac, pain was better when on medication  Allergies: Allergies  Allergen Reactions  . Chloroquine Itching   Current Facility-Administered Medications on File Prior to Encounter  Medication Dose Route Frequency Provider Last Rate Last Dose  . aspirin chewable tablet 324 mg  324 mg Oral Once Reuben Likes, MD   324 mg at 07/12/12 1217  . DISCONTD: 0.9 %  sodium chloride infusion   Intravenous Continuous Reuben Likes, MD 50 mL/hr at 07/12/12 1220    . DISCONTD: nitroGLYCERIN (NITROSTAT) SL tablet 0.4 mg  0.4 mg Sublingual Q5 Min x 3 PRN Reuben Likes, MD       Current Outpatient Prescriptions on File Prior to Encounter  Medication Sig Dispense Refill  . aspirin 81 MG tablet Take 81 mg by mouth as needed. For pain       Review Of Systems: Per HPI  Otherwise 12 point review of systems was performed and was unremarkable.  Physical Exam: BP 138/82  Pulse 91  Temp 98.2 F (36.8 C) (Oral)  Resp 18  SpO2  100% Exam: General: NAD, WN/WD HEENT: [employer]/AT, PERRLA, MMM Cardiovascular: RRR, No Murmurs gallops or rubs, no S3/S4 Respiratory: CTA B/L, no wheezes or crackles. No pleurisy  Abdomen: Soft, NT/ND, NABS, No HSM Extremities: No edema, Pulses +2 B/L  Skin: No rashes MSK: TTP at 3rd L IC space with deep palpation.  Not exacerbated with arm abduction or external rotation Neuro: CN 2-12. AAO x 3  Labs and Imaging: CBC BMET   Lab 07/12/12 1424  WBC 6.0  HGB 12.9*  HCT 37.9*  PLT 160    No results found for this basename: CKTOTAL, CKMB, CKMBINDEX, TROPONINI     Lab 07/12/12 1424  NA 139  K 3.7  CL 105  CO2 26  BUN 11  CREATININE 1.03  GLUCOSE 107*  CALCIUM 9.6  EKG 07/12/12: TWI V3/V4 possibly changed from previous    Gildardo Cranker, DO , PGY1 07/12/2012, 4:12 PM  Family Medicine Upper Level Addendum:   I have seen and examined the patient independently, discussed with Dr. Paulina Fusi, fully reviewed the H+P and agree with it's contents with the additions as noted in blue text. My independent exam is below.   BP 142/72  Pulse 84  Temp 98.8 F (37.1 C) (Oral)  Resp 16  Ht 6' (1.829 m)  Wt 187 lb 13.3 oz (85.2 kg)  BMI 25.47 kg/m2  SpO2 100% Gen: NAD, resting comfortably in bed HEENT: NCAT, MMM, PERRLA  CV: RRR no mrg  Lungs: CTAB  Abd: soft/nontender/nondistended/normal bowel sounds  MSK: moves all extremities, no edema, tenderness with deep palpation to chest wall just to left of sternum at approximately the 3rd rib.  Skin: warm and dry, no rash  Neuro: alert and oriented x3, grossly normal   Patient is a 46 year old male with minimal risk factors for CAD (HLD which patient has been treating with diet/exercise but with high HDL) with chest pain for the last 2 years which does not appear to be worse than previous flares. I highly suspect MSK or GERD etiology. Have discussed t wave inversions since Dr. Nadara Eaton last saw patient who plans to evaluate patient. Appreciate Dr.  Jodi Marble input as he knows the patient and his history quite well.    Tana Conch, MD, PGY2 07/12/2012 9:01 PM

## 2012-07-12 NOTE — ED Provider Notes (Addendum)
Date: 07/12/2012  Rate: 77  Rhythm: normal sinus rhythm  QRS Axis: normal  Intervals: normal  ST/T Wave abnormalities: nonspecific T wave changes  Conduction Disutrbances:none  Narrative Interpretation: Left atrial hypertrophy, nonspecific T wave flattening. When compared with ECG of 11/10/2010, nonspecific T-wave flattening is now present.  Old EKG Reviewed: changes noted   Dione Booze, MD 07/12/12 7260  46 year old male has been having episodes of chest heaviness and tightness today. There no associated nausea, vomiting, dyspnea, diaphoresis. He had been evaluated for chest pain one year ago and pain at that time was felt to be chest wall pain. He went to urgent care where an ECG showed changes compared with baseline and he was referred to the emergency department. On exam, he is mildly hypertensive. Lungs are clear and heart has regular rate and rhythm without murmur. There is no cyanosis or IMA. ECG does show new T wave flattening. In light of episodes of chest pain which do sound might they may be ischemic and then the T wave flattening, he will need to be admitted for further cardiac evaluation.   Medical screening examination/treatment/procedure(s) were conducted as a shared visit with non-physician practitioner(s) and myself.  I personally evaluated the patient during the encounter   Dione Booze, MD 07/12/12 1530

## 2012-07-12 NOTE — ED Provider Notes (Signed)
Chief Complaint  Patient presents with  . Chest Pain    History of Present Illness:   Donald Wyatt is a 46 year old male who experienced onset of left mammary chest pain at 8:45 AM at work this morning. He describes the pain as feeling sharp and heavy and is rated 4-5/10 in intensity. It does not radiate into the neck, back, or down the arm. The pain is worse with bending, twisting, turning, and deep inspiration. It's not associated with shortness of breath, nausea, or diaphoresis. He has had similar episodes of chest pain before in 2011 and 2012. He was evaluated for both of these in the emergency department, but no evidence of cardiac disease was found. He denies any history of diabetes, hypertension, cigarette smoking, or family history of heart disease. His last lipid profile showed a cholesterol of 247, and HDL of 66, and LDL of 173. He has had no fever or chills. He denies coughing, wheezing, or shortness of breath. He has had no palpitations, dizziness, or syncope. He denies any abdominal pain, nausea, or vomiting. He has had some diarrhea. He denies any leg pain or swelling.  Review of Systems:  Other than noted above, the patient denies any of the following symptoms. Systemic:  No fever, chills, sweats, or fatigue. ENT:  No nasal congestion, rhinorrhea, or sore throat. Pulmonary:  No cough, wheezing, shortness of breath, sputum production, hemoptysis. Cardiac:  No palpitations, rapid heartbeat, dizziness, presyncope or syncope. GI:  No abdominal pain, heartburn, nausea, or vomiting. Ext:  No leg pain or swelling.  PMFSH:  Past medical history, family history, social history, meds, and allergies were reviewed and updated as needed.   Physical Exam:   Vital signs:  BP 154/91  Pulse 68  Temp 98.2 F (36.8 C) (Oral)  Resp 18  SpO2 98% Gen:  Alert, oriented, in no distress, skin warm and dry. Eye:  PERRL, lids and conjunctivas normal.  Sclera non-icteric. ENT:  Mucous membranes moist, pharynx  clear. Neck:  Supple, no adenopathy or tenderness.  No JVD. Lungs:  Clear to auscultation, no wheezes, rales or rhonchi.  No respiratory distress. Heart:  Regular rhythm.  No gallops, murmers, clicks or rubs. Chest:  There is chest wall tenderness to palpation in the left pectoral area. Abdomen:  Soft, nontender, no organomegaly or mass.  Bowel sounds normal.  No pulsatile abdominal mass or bruit. Ext:  No edema.  No calf tenderness and Homann's sign negative.  Pulses full and equal. Skin:  Warm and dry.  No rash.  EKG:   Date: 07/12/2012  Rate: 71  Rhythm: normal sinus rhythm  QRS Axis: normal  Intervals: normal  ST/T Wave abnormalities: nonspecific T wave changes  Conduction Disutrbances:none  Narrative Interpretation: Normal sinus rhythm, nonspecific T wave abnormality with T-wave inversions in leads III, V1, V3, V4, and V5.  Old EKG Reviewed: The T-wave inversions are new since his previous EKG done 03/31/2011.   Medications given in U[facility]:  He was started on IV normal saline at 50 mL per hour, given aspirin 325 mg orally and nitroglycerin 0.4 mg sublingually. He was monitored and will be transferred to the emergency department via EMS.  Assessment:  There were no encounter diagnoses.   Plan:   1.  The following meds were prescribed:   New Prescriptions   No medications on file   2.  The patient was transferred to the emergency department via EMS in stable condition.   Reuben Likes, MD 07/12/12 [phone]

## 2012-07-12 NOTE — ED Notes (Signed)
Per EMS: 20 IV in left hand. Urgent care gave baby aspirin, EMS gave 1 of nitro. Episode of left sided chest pain with no radiating. Denies SOB, N/V. Describes as a pressure type of feel

## 2012-07-12 NOTE — ED Notes (Signed)
Transport called [facility] - Press photographer sabrina given report

## 2012-07-12 NOTE — ED Notes (Signed)
MD at bedside; admitting

## 2012-07-12 NOTE — ED Notes (Signed)
Pt placed in room from hallway; myself and Heather S, EMT placed pt on monitor, continuous pulse oximetry and blood pressure cuff; EKG performed

## 2012-07-12 NOTE — ED Notes (Signed)
Pt reports chest pain that started at 845 this morning  - denies radiating pain down arm or neck/ denies nausea/v/d - denies any previous cardiac conditions

## 2012-07-13 ENCOUNTER — Encounter (HOSPITAL_COMMUNITY)
Admission: EM | Disposition: A | Discharge status: Home / Self Care | Payer: Self-pay | Source: Home / Self Care | Attending: Emergency Medicine

## 2012-07-13 DIAGNOSIS — R0789 Other chest pain: Secondary | ICD-10-CM | POA: Diagnosis present

## 2012-07-13 LAB — BASIC METABOLIC PANEL
BUN: 12 mg/dL (ref 6–23)
Chloride: 106 mEq/L (ref 96–112)
Creatinine, Ser: 1.05 mg/dL (ref 0.50–1.35)
GFR calc Af Amer: >90 mL/min (ref >90)
Glucose, Bld: 92 mg/dL (ref 70–99)

## 2012-07-13 LAB — CBC
HCT: 36.8 % — ABNORMAL LOW (ref 39.0–52.0)
Hemoglobin: 12.3 g/dL — ABNORMAL LOW (ref 13.0–17.0)
MCV: 67.3 fL — ABNORMAL LOW (ref 78.0–100.0)
RDW: 16 % — ABNORMAL HIGH (ref 11.5–15.5)
WBC: 7.6 10*3/uL (ref 4.0–10.5)

## 2012-07-13 LAB — LIPID PANEL
HDL: 76 mg/dL (ref >39)
LDL Cholesterol: 131 mg/dL — ABNORMAL HIGH (ref 0–99)
Total CHOL/HDL Ratio: 2.8 RATIO
VLDL: 9 mg/dL (ref 0–40)

## 2012-07-13 LAB — FOLATE: Folate: 19.2 ng/mL

## 2012-07-13 LAB — RETICULOCYTES
RBC.: 5.61 MIL/uL (ref 4.22–5.81)
Retic Count, Absolute: 106.6 10*3/uL (ref 19.0–186.0)
Retic Ct Pct: 1.9 % (ref 0.4–3.1)

## 2012-07-13 LAB — IRON AND TIBC
Iron: 101 ug/dL (ref 42–135)
Saturation Ratios: 34 % (ref 20–55)
TIBC: 300 ug/dL (ref 215–435)
UIBC: 199 ug/dL (ref 125–400)

## 2012-07-13 LAB — TROPONIN I: Troponin I: <0.3 ng/mL (ref <0.30)

## 2012-07-13 LAB — FERRITIN: Ferritin: 40 ng/mL (ref 22–322)

## 2012-07-13 SURGERY — LEFT HEART CATHETERIZATION WITH CORONARY ANGIOGRAM
Anesthesia: LOCAL

## 2012-07-13 MED ORDER — ASPIRIN 81 MG PO CHEW
324.0000 mg | CHEWABLE_TABLET | ORAL | Status: AC
  Administered 2012-07-13: 324 mg via ORAL
  Filled 2012-07-13: qty 4

## 2012-07-13 MED ORDER — SODIUM CHLORIDE 0.9 % IV SOLN: 250.0000 mL | INTRAVENOUS | Status: DC | PRN

## 2012-07-13 MED ORDER — OMEPRAZOLE 20 MG PO CPDR: 20.0000 mg | DELAYED_RELEASE_CAPSULE | Freq: Every day | ORAL | Status: DC

## 2012-07-13 MED ORDER — SODIUM CHLORIDE 0.9 % IJ SOLN: 3.0000 mL | INTRAMUSCULAR | Status: DC | PRN

## 2012-07-13 MED ORDER — SODIUM CHLORIDE 0.9 % IJ SOLN: 3.0000 mL | Freq: Two times a day (BID) | INTRAMUSCULAR | Status: DC

## 2012-07-13 NOTE — Care Management Note (Signed)
    Page 1 of 1   07/13/2012     2:58:19 PM   CARE MANAGEMENT NOTE 07/13/2012  Patient:  Donald Wyatt, Donald Wyatt   Account Number:  [id number]  Date Initiated:  07/13/2012  Documentation initiated by:  Julieann Drummonds  Subjective/Objective Assessment:   PT ADM ON 07/12/12 WITH CHEST PAIN.  PTA, PT INDEPENDENT, LIVES WITH SPOUSE.     Action/Plan:   WILL FOLLOW FOR HOME NEEDS AS PT PROGRESSES.   Anticipated DC Date:  07/13/2012   Anticipated DC Plan:  HOME/SELF CARE      DC Planning Services  CM consult      Choice offered to / List presented to:             Status of service:  Completed, signed off Medicare Important Message given?   (If response is "NO", the following Medicare IM given date fields will be blank) Date Medicare IM given:   Date Additional Medicare IM given:    Discharge Disposition:  HOME/SELF CARE  Per UR Regulation:  Reviewed for med. necessity/level of care/duration of stay  If discussed at Long Length of Stay Meetings, dates discussed:    Comments:

## 2012-07-13 NOTE — Discharge Summary (Signed)
Physician Discharge Summary  Patient ID: Donald Wyatt MRN: [id number] DOB: Jan 25, 1966 Age: 46 y.o.  Admit date: 07/12/2012 Discharge date: 07/13/2012 Admitting Physician: Leighton Roach McDiarmid, MD  PCP: No primary provider on file.  Consultants:Cardiology      Discharge Diagnosis: Principal Problem:  *Atypical chest pain Active Problems:  GERD (gastroesophageal reflux disease)    Hospital Course 1. Atypical Chest Pain - Pt presented to the ED with ongoing chest pain for multiple months that had worsened the two days previous.  It was not exacerbated by activity and was unrelieved by rest and was not substernal.  His TIMI score was 1 at the time of admission (ASA in prior 7 days), his 10 year risk was 5%, and his RF for CAD came to zero after correction for his HDL.  He had risk stratification labs as well which showed a TSH of 0.2 along with a lipid profile with TC of 216 and LDL of 131 and HDL 67.  The ED got an EKG concerning for TWI in II V1 V3 V4 that looked possibly changed from a previous one in May.  He received one dose of nitro SL which subjectively slightly relieved his pain.  Troponins were ordered and cycled times three, which were all negative.  Dr. [name], who evaluated the pt with a previous hospitalization for the same problem and performed a normal stress tests, evaluated the pt and followed his cardiac markers.  Upon evaluation in the AM, an EKG was unchanged and Dr. [name] felt comfortably sending the pt home with f/u in the next month.  Most likely cause was GERD and was started on Protonix for this.  His LDL was 130 and with a 10 yr risk less than 10% did not feel the need to start on a statin. Pt felt comfortable before discharge, his chest pain had improved, and he was sent home on ASA with close f/u.  2. GERD - Pt stated he has felt more reflux recently but has not been on a medication recently.  Was started on Protonix 80 mg PO in the hospital and d/c on Prilosec 40 mg qd.    3. Anemia of undetermined cause - Pt was found to have a Hgb of 12.7 on admission with MCV of 67.  He had no active bleeding and was ASx.  An anemia panel was ordered and showed normal labs including Ferritin, TIBC, iron, B12.  His Retic % was 1.7 and target cells were seen on a peripheral smear.  Pt was asymptomatic during his entire stay.  4. Subclinical Hyperthyroidism - TSH 0.2 on admission.  This was similar to his previous hospitalization.          Discharge PE   Filed Vitals:   07/13/12 0523  BP: 110/65  Pulse: 63  Temp: 98.2 F (36.8 C)  Resp: 14   General: NAD, WN/WD  HEENT: [place]/AT, PERRLA, MMM  Cardiovascular: RRR, No Murmurs gallops or rubs, no S3/S4  Respiratory: CTA B/L, no wheezes or crackles. No pleurisy  Abdomen: Soft, NT/ND, NABS, No HSM  Extremities: No edema, Pulses +2 B/L  Skin: No rashes  MSK: TTP at 3rd L IC space with deep palpation. Not exacerbated with arm abduction or external rotation  Neuro: CN 2-12. AAO x 3     Procedures/Imaging:  Dg Chest 2 View  10/15/2013IMPRESSION: No acute cardiopulmonary findings.   Original Report Authenticated By: P. Loralie Champagne, M.D.     Labs  CBC  Lab 07/13/12 0216  07/12/12 2018 07/12/12 1424  WBC 7.6 6.9 6.0  HGB 12.3* 13.1 12.9*  HCT 36.8* 38.7* 37.9*  PLT 172 167 160   BMET  Lab 07/13/12 0216 07/12/12 2018 07/12/12 1424  NA 138 -- 139  K 3.5 -- 3.7  CL 106 -- 105  CO2 24 -- 26  BUN 12 -- 11  CREATININE 1.05 1.02 1.03  CALCIUM 9.2 -- 9.6  PROT -- -- --  BILITOT -- -- --  ALKPHOS -- -- --  ALT -- -- --  AST -- -- --  GLUCOSE 92 -- 107*   TROPONIN I     Status: Normal   Collection Time   07/12/12  8:18 PM      Component Value Range Comment   Troponin I <0.30  <0.30 ng/mL   TSH     Status: Abnormal   Collection Time   07/12/12  8:18 PM      Component Value Range Comment   TSH 0.216 (*) 0.350 - 4.500 uIU/mL   VITAMIN B12     Status: Normal   Collection Time   07/12/12  8:18 PM       Component Value Range Comment   Vitamin B-12 635  211 - 911 pg/mL   IRON AND TIBC     Status: Normal   Collection Time   07/12/12  8:18 PM      Component Value Range Comment   Iron 101  42 - 135 ug/dL    TIBC 960  454 - 098 ug/dL    Saturation Ratios 34  20 - 55 %    UIBC 199  125 - 400 ug/dL   FERRITIN     Status: Normal   Collection Time   07/12/12  8:18 PM      Component Value Range Comment   Ferritin 40  22 - 322 ng/mL   RETICULOCYTES     Status: Normal   Collection Time   07/12/12  8:18 PM      Component Value Range Comment   Retic Ct Pct 1.7  0.4 - 3.1 %    RBC. 5.73  4.22 - 5.81 MIL/uL    Retic Count, Manual 97.4  19.0 - 186.0 K/uL   TROPONIN I     Status: Normal   Collection Time   07/13/12  2:16 AM      Component Value Range Comment   Troponin I <0.30  <0.30 ng/mL   LIPID PANEL     Status: Abnormal   Collection Time   07/13/12  2:16 AM      Component Value Range Comment   Cholesterol 216 (*) 0 - 200 mg/dL    Triglycerides 44  <119 mg/dL    HDL 76  >14 mg/dL    Total CHOL/HDL Ratio 2.8      VLDL 9  0 - 40 mg/dL    LDL Cholesterol 782 (*) 0 - 99 mg/dL   RETICULOCYTES     Status: Normal   Collection Time   07/13/12 10:21 AM      Component Value Range Comment   Retic Ct Pct 1.9  0.4 - 3.1 %    RBC. 5.61  4.22 - 5.81 MIL/uL    Retic Count, Manual 106.6  19.0 - 186.0 K/uL        Patient condition at time of discharge/disposition: stable  Disposition-home   Follow up issues: 1. GERD issues.  Placed on Prilosec before d/c 2. Chest pain ongoing.  F/U with  Dr. [name] in the next month 3. Anemia - Consider Sickle Cell [place] vs Thalassemia with electrophoresis  4. Subclinical Hyperthyroidsim - Continue follow up   Discharge follow up:    Follow-up Information    Follow up with DAUB, STEVE A, MD. Schedule an appointment as soon as possible for a visit in 5 days.   Contact information:   [address] [place] [address] [phone]       Follow up with  Pamella Pert, MD. Schedule an appointment as soon as possible for a visit in 1 month.   Contact information:   [address]  [place] [address] [phone]           Discharge Orders    Future Orders Please Complete By Expires   Diet - low sodium heart healthy      Increase activity slowly      Call MD for:  persistant nausea and vomiting      Call MD for:  persistant dizziness or light-headedness      Call MD for:  difficulty breathing, headache or visual disturbances           Discharge Instructions: Please refer to Patient Instructions section of EMR for full details.  Patient was counseled important signs and symptoms that should prompt return to medical care, changes in medications, dietary instructions, activity restrictions, and follow up appointments.  Significant instructions noted below:    Discharge Medications   Medication List     As of 07/13/2012 12:36 PM    START taking these medications         omeprazole 20 MG capsule   Commonly known as: PRILOSEC   Take 1 capsule (20 mg total) by mouth daily.      CONTINUE taking these medications         aspirin 81 MG tablet          Where to get your medications    These are the prescriptions that you need to pick up. We sent them to a specific pharmacy, so you will need to go there to get them.   [facility] [address], [place] - [address] AT [facility] [address]    [place] [address]    Phone: [phone]    Hours: 24-hours        omeprazole 20 MG capsule                Gildardo Cranker, DO of [facility] 07/13/2012 12:19 PM

## 2012-07-13 NOTE — H&P (Signed)
I have seen and examined this patient. I have discussed with Dr (s) Durene Cal and Paulina Fusi.  I agree with their findings and plans as documented in their admission notes.  Acute Issues 1. Atypical chest pain, chronic, recurrent - Not substernal, Not related to rest, uncertain relation to NTG SL - Can be precipitated by torso movement - History of GER - Cardiac Risk factor: Age, Male, Elevated LDL (Negative Risk factor of High HDL) - Adequate, low prob ETT last year by Dr [name]. - EKG with persistent TWI V1 - 4 from April 2012. - Troponin I neg x 2. - CHEST XRAY unremarkable. - Tender to palpation left precordium - Working differential: Nonspecific Chest Wall Pain, Esophageal process, Unstable angina.  Favor Chest wall or esophageal as pain generators. Plan:  Complete ACS R/O  Follow up Dr Verl Dicker recommendations for further coronary disease work-up  Agree with empiric trial of High dose PPI therapy for 2 to 4 weeks.  Consider topical NSAID or Lidoderm patch to chest wall tenderness sites.   2. Abnormal TSH - Possible autoimmune hyperthyroidism Vs subclinical hyperthyroidism Vs Transient hyperthyroidism. Plan:  Follow-up Free T4 and Free T3   3. Microcytic RBC - Given normal RBC count and microcytic RBC, minor beta thalassemia in patient of African origin is quite possible, but since RDW is wide, look for evidence of iron deficiency is prudent.  Plan:  Follow up serum ferritin level.  Hemoccult stools

## 2012-07-13 NOTE — Progress Notes (Signed)
07/13/2012 1:28 PM Nursing note Discharge avs form, medications already taken today and those due this evening given and explained to patient and family. Location of called in RX explained to patient. F/u appointments and when to call MD discussed. Questions and concerns addressed. D/c iv line. D/c tele. D/c home per orders.  Dory Demont, Blanchard Kelch

## 2012-07-13 NOTE — Progress Notes (Signed)
See Dr McDiarmid's attending note for patient admission for today.  Paula Compton, MD

## 2012-07-13 NOTE — Progress Notes (Signed)
PGY-1 Daily Progress Note Family Medicine Teaching Service [place] R. Drystan Reader, DO Service Pager: [phone]   Subjective: Pt doing well today.  Has intermittent CP but is going for cath this AM.   Objective:  VITALS Temp:  [98.2 F (36.8 C)-99 F (37.2 C)] 98.2 F (36.8 C) (10/16 0523) Pulse Rate:  [63-91] 63  (10/16 0523) Resp:  [14-18] 14  (10/16 0523) BP: (110-154)/(65-91) 110/65 mmHg (10/16 0523) SpO2:  [98 %-100 %] 99 % (10/16 0523) Weight:  [187 lb 13.3 oz (85.2 kg)] 187 lb 13.3 oz (85.2 kg) (10/15 1835)  In/Out  Intake/Output Summary (Last 24 hours) at 07/13/12 0929 Last data filed at 07/13/12 0600  Gross per 24 hour  Intake   1590 ml  Output      0 ml  Net   1590 ml    Exam:  General: NAD, WN/WD  HEENT: [place]/AT, PERRLA, MMM  Cardiovascular: RRR, No Murmurs gallops or rubs, no S3/S4  Respiratory: CTA B/L, no wheezes or crackles. No pleurisy  Abdomen: Soft, NT/ND, NABS, No HSM  Extremities: No edema, Pulses +2 B/L  Skin: No rashes  MSK: TTP at 3rd L IC space with deep palpation. Not exacerbated with arm abduction or external rotation  Neuro: CN 2-12. AAO x 3   MEDS Scheduled Meds:   . sodium chloride   Intravenous STAT  . aspirin  324 mg Oral NOW   Or  . aspirin  300 mg Rectal NOW  . aspirin  324 mg Oral Pre-Cath  . aspirin EC  81 mg Oral Daily  . heparin  5,000 Units Subcutaneous Q8H  . pantoprazole  80 mg Oral Daily  . sodium chloride  3 mL Intravenous Q12H  . DISCONTD: sodium chloride  3 mL Intravenous Q12H   Continuous Infusions:   . DISCONTD: sodium chloride 150 mL/hr at 07/13/12 0409   PRN Meds:.sodium chloride, acetaminophen, nitroGLYCERIN, ondansetron (ZOFRAN) IV, sodium chloride, DISCONTD: sodium chloride, DISCONTD: ondansetron (ZOFRAN) IV, DISCONTD: sodium chloride  Labs and imaging:   CBC  Lab 07/13/12 0216 07/12/12 2018 07/12/12 1424  WBC 7.6 6.9 6.0  HGB 12.3* 13.1 12.9*  HCT 36.8* 38.7* 37.9*  PLT 172 167 160   BMET/CMET  Lab  07/13/12 0216 07/12/12 2018 07/12/12 1424  NA 138 -- 139  K 3.5 -- 3.7  CL 106 -- 105  CO2 24 -- 26  BUN 12 -- 11  CREATININE 1.05 1.02 1.03  CALCIUM 9.2 -- 9.6  PROT -- -- --  BILITOT -- -- --  ALKPHOS -- -- --  ALT -- -- --  AST -- -- --  GLUCOSE 92 -- 107*   VITAMIN B12     Status: Normal   Collection Time   07/12/12  8:18 PM      Component Value Range   Vitamin B-12 635  211 - 911 pg/mL  IRON AND TIBC     Status: Normal   Collection Time   07/12/12  8:18 PM      Component Value Range   Iron 101  42 - 135 ug/dL   TIBC 454  098 - 119 ug/dL   Saturation Ratios 34  20 - 55 %   UIBC 199  125 - 400 ug/dL  FERRITIN     Status: Normal   Collection Time   07/12/12  8:18 PM      Component Value Range   Ferritin 40  22 - 322 ng/mL  RETICULOCYTES     Status: Normal  Collection Time   07/12/12  8:18 PM      Component Value Range   Retic Ct Pct 1.7  0.4 - 3.1 %   RBC. 5.73  4.22 - 5.81 MIL/uL   Retic Count, Manual 97.4  19.0 - 186.0 K/uL  TROPONIN I     Status: Normal   Collection Time   07/13/12  2:16 AM      Component Value Range   Troponin I <0.30  <0.30 ng/mL  LIPID PANEL     Status: Abnormal   Collection Time   07/13/12  2:16 AM      Component Value Range   Cholesterol 216 (*) 0 - 200 mg/dL   Triglycerides 44  <147 mg/dL   HDL 76  >82 mg/dL   Total CHOL/HDL Ratio 2.8     VLDL 9  0 - 40 mg/dL   LDL Cholesterol 956 (*) 0 - 99 mg/dL   Dg Chest 2 View  21/30/8657    IMPRESSION: No acute cardiopulmonary findings.   Original Report Authenticated By: P. Loralie Champagne, M.D.         Assessment  Pt is a 46 y.o. patient who presented to the ED with atypical chest pain.    Plan:  1. Atypical Chest Pain - Pt EKG concerning for TWI in II V1 V3 V4 that looks unchanged from previous EKG     1. Spoke with Dr. [name] yesterday.  Came to evaluate the pt last night and feels that this is not related to unstable angina.  However, has had ongoing chest pain for several  months now and had a negative stress test per Dr. [name] in the past 2. Dr. [name] plans on taking pt to cath lab today to evaluate for CAD.  Will f/u recommendations and input greatly appreciated.  3. Troponins negative x 2.  Follow up last set of markers.  4. Lipid Profile shows TC of 216 and LDL of 131 with HDL of 76.  Lipids well controlled and at this point does not need to be a statin as his LDL is under 160 with CAD RF calculations of 0.  This may change with findings from cath.  5. Saturations >98% on RA all night. No SOB or tachypnea/tachycardia  6. Continue to monitor Vitals. TelemetryNitro and Morphine PRN. Continue ASA  7. Most likely GERD.  Will consider restarting PPI upon findings from cath long term.  Currently on Protonix 80 mg qd and will reassess response to this.  2. Subclinical Hyperthyroidism - TSH 0.2 on check yesterday 1. Will get Free T4 today and reassess 3. GERD - Pt feels has worsened, Consider starting PPI.  4. Anemia - Decreased Hgb to 12.3. MCV of 68.5.  Has been low for past year. 1. Anemia Panel normal.  Consider with MCV consistently low possible Thalassemia workup in outpt.  Will not give Iron as do not want to overload him.  Does have Target cells on peripheral smear consistent with Thalessemia vs Hgb Preston disease.  Will get repeat Retic today as yesterday was 1.7.   2. Consider Colonoscopy in future depending on anemia workup. 5. FEN/GI: Saline Lock. Regular Diet  6. Prophylaxis: Heparin SQ 7. Disposition: Going for cath today.  If findings negative, can be d/c home with close f/u with Dr. [name] and his PCP 8. Code Status: Full Kirstein Baxley R. Cleatus Gabriel DO, FMTS PGY-1

## 2012-07-13 NOTE — Progress Notes (Addendum)
Subjective:  No further chest pain since admission to the hospital. Patient is feeling well.  Objective:  Vital Signs in the last 24 hours: Temp:  [98.2 F (36.8 C)-99 F (37.2 C)] 98.2 F (36.8 C) (10/16 0523) Pulse Rate:  [63-91] 63  (10/16 0523) Resp:  [14-18] 14  (10/16 0523) BP: (110-154)/(65-91) 110/65 mmHg (10/16 0523) SpO2:  [98 %-100 %] 99 % (10/16 0523) Weight:  [85.2 kg (187 lb 13.3 oz)] 85.2 kg (187 lb 13.3 oz) (10/15 1835)  Intake/Output from previous day: 10/15 0701 - 10/16 0700 In: 1590 [P.O.:240; I.V.:1350] Out: -   Physical Exam:   General appearance: alert, cooperative and appears stated age Eyes: conjunctivae/corneas clear. PERRL, EOM's intact. Fundi benign. Neck: no adenopathy, no carotid bruit, no JVD, supple, symmetrical, trachea midline and thyroid not enlarged, symmetric, no tenderness/mass/nodules Neck: JVP - normal, carotids 2+= without bruits Resp: clear to auscultation bilaterally Chest wall: no tenderness Cardio: regular rate and rhythm, S1, S2 normal, no murmur, click, rub or gallop GI: soft, non-tender; bowel sounds normal; no masses,  no organomegaly Extremities: extremities normal, atraumatic, no cyanosis or edema    Lab Results:  Basename 07/13/12 0216 07/12/12 2018  WBC 7.6 6.9  HGB 12.3* 13.1  PLT 172 167    Basename 07/13/12 0216 07/12/12 2018 07/12/12 1424  NA 138 -- 139  K 3.5 -- 3.7  CL 106 -- 105  CO2 24 -- 26  GLUCOSE 92 -- 107*  BUN 12 -- 11  CREATININE 1.05 1.02 --    Basename 07/13/12 0216 07/12/12 2018  TROPONINI <0.30 <0.30   Hepatic Function Panel No results found for this basename: PROT,ALBUMIN,AST,ALT,ALKPHOS,BILITOT,BILIDIR,IBILI in the last 72 hours  Basename 07/13/12 0216  CHOL 216*   No results found for this basename: PROTIME in the last 72 hours Lipid Panel     Component Value Date/Time   CHOL 216* 07/13/2012 0216   TRIG 44 07/13/2012 0216   HDL 76 07/13/2012 0216   CHOLHDL 2.8 07/13/2012 0216    VLDL 9 07/13/2012 0216   LDLCALC 131* 07/13/2012 0216     Imaging: Dg Chest 2 View  07/12/2012  *RADIOLOGY REPORT*  Clinical Data: Chest pain.  CHEST - 2 VIEW  Comparison: 10/10/2010.  Findings: The cardiac silhouette, mediastinal and hilar contours are normal and stable.  The lungs are clear.  No pleural effusion. The bony thorax is intact.  IMPRESSION: No acute cardiopulmonary findings.   Original Report Authenticated By: P. Loralie Champagne, M.D.     Cardiac Studies:  EKG: unchanged from previous tracings, normal sinus rhythm, nonspecific ST and T waves changes.   Assessment/Plan:  1. Chest pain probably due to GERD 2. Hyperlipidemia mild 3. Abnormal EKG showing nonspecific ST-T changes. 4. Microcytosis and toxic granules on CBC  Recommendation: I have canceled the cardiac catheterization by orders at 6am after I reviewed his EKG and his laboratory data online. Now at the bedside, he is essentially asymptomatic. I would recommend we treat him for GERD and see how his symptoms improved. I would like to see him back in one month on therapy with PPI. I may consider doing a advanced lipid profile, NMR lipoprofile for further risk stratification. Please do not hesitate to contact me and I left my contact number in the consultation note from earlier this morning. Please see my addendum in the consult.   Pamella Pert, M.D. 07/13/2012, 8:59 AM

## 2012-07-13 NOTE — Discharge Summary (Signed)
FMTS Attending Note Patient seen by Dr McDiarmid on admission.  I discussed the patient's care with resident team and agree with plan for discharge. Paula Compton, MD

## 2012-07-18 ENCOUNTER — Telehealth: Payer: Self-pay

## 2012-10-08 ENCOUNTER — Other Ambulatory Visit (HOSPITAL_COMMUNITY): Payer: Self-pay | Admitting: Family Medicine

## 2012-10-21 ENCOUNTER — Telehealth: Payer: Self-pay

## 2012-10-21 NOTE — Telephone Encounter (Signed)
Patient is requesting additional omeprazole (PRILOSEC) 20 MG capsule States denied by pharmacy   [phone]

## 2012-10-24 MED ORDER — OMEPRAZOLE 20 MG PO CPDR: 20.0000 mg | DELAYED_RELEASE_CAPSULE | Freq: Every day | ORAL | Status: DC

## 2012-10-24 NOTE — Telephone Encounter (Signed)
Rx sent to pharmacy   

## 2012-12-06 ENCOUNTER — Ambulatory Visit (INDEPENDENT_AMBULATORY_CARE_PROVIDER_SITE_OTHER): Payer: BC Managed Care – PPO | Admitting: Internal Medicine

## 2012-12-06 VITALS — BP 110/78 | HR 75 | Temp 98.6°F | Resp 16 | Ht 71.0 in | Wt 195.2 lb

## 2012-12-06 DIAGNOSIS — I10 Essential (primary) hypertension: Secondary | ICD-10-CM

## 2012-12-06 DIAGNOSIS — D509 Iron deficiency anemia, unspecified: Secondary | ICD-10-CM

## 2012-12-06 DIAGNOSIS — R079 Chest pain, unspecified: Secondary | ICD-10-CM

## 2012-12-06 DIAGNOSIS — Z23 Encounter for immunization: Secondary | ICD-10-CM

## 2012-12-06 DIAGNOSIS — E785 Hyperlipidemia, unspecified: Secondary | ICD-10-CM | POA: Insufficient documentation

## 2012-12-06 LAB — T4, FREE: Free T4: 1.29 ng/dL (ref 0.80–1.80)

## 2012-12-06 LAB — POCT CBC
Granulocyte percent: 38.5 %G (ref 37–80)
HCT, POC: 40.8 % — AB (ref 43.5–53.7)
Hemoglobin: 12.5 g/dL — AB (ref 14.1–18.1)
MCV: 71.4 fL — AB (ref 80–97)
POC LYMPH PERCENT: 51.8 %L — AB (ref 10–50)
RDW, POC: 17.3 %

## 2012-12-06 LAB — TSH: TSH: 0.4 u[IU]/mL (ref 0.350–4.500)

## 2012-12-06 NOTE — Progress Notes (Signed)
  Subjective:    Patient ID: Donald Wyatt, male    DOB: 1965-10-31, 47 y.o.   MRN: [id number]  HPI here for followup, for medication refills and for advice after cardiovascular consult 6 months ago Cardiology evaluation was normal including echo He has hypertension and hyperlipidemia He continues with the chest pain that occurs when he twists or reaches and lifts, but not with activity. He has no shortness of breath or palpitations. No edema. No cough. No wheezing. History of reflux but not very active.  Current medications-, Cozaar Prilosec, simvastatin Labs last October revealed mild hyperlipidemia, microcytic anemia, suppressed TSH.  Immunizations up to date  Review of Systems No weight loss or weight gain No fatigue No fever chills or night sweats No shortness of breath No palpitations No edema No easy fatigability with exercise     Objective:   Physical Exam BP 110/78  Pulse 75  Temp(Src) 98.6 F (37 C) (Oral)  Resp 16  Ht 5\' 11"  (1.803 m)  Wt 195 lb 3.2 oz (88.542 kg)  BMI 27.24 kg/m2  SpO2 99% HEENT clear No thyromegaly or lymphadenopathy Heart regular without murmur/rate 65 Chest wall nontender Twisting creates some discomfort at the anterior costosternal junction on the right Abdomen benign Extremities clear       Assessment & Plan:

## 2012-12-07 LAB — COMPREHENSIVE METABOLIC PANEL
Albumin: 5 g/dL (ref 3.5–5.2)
Alkaline Phosphatase: 48 U/L (ref 39–117)
BUN: 17 mg/dL (ref 6–23)
CO2: 27 mEq/L (ref 19–32)
Calcium: 9.7 mg/dL (ref 8.4–10.5)
Chloride: 105 mEq/L (ref 96–112)
Glucose, Bld: 100 mg/dL — ABNORMAL HIGH (ref 70–99)
Potassium: 4.1 mEq/L (ref 3.5–5.3)
Sodium: 138 mEq/L (ref 135–145)
Total Protein: 7.7 g/dL (ref 6.0–8.3)

## 2012-12-07 LAB — LIPID PANEL: Cholesterol: 203 mg/dL — ABNORMAL HIGH (ref 0–200)

## 2012-12-08 ENCOUNTER — Encounter: Payer: Self-pay | Admitting: Internal Medicine

## 2012-12-08 DIAGNOSIS — E785 Hyperlipidemia, unspecified: Secondary | ICD-10-CM

## 2012-12-08 MED ORDER — ATORVASTATIN CALCIUM 40 MG PO TABS: 40.0000 mg | ORAL_TABLET | Freq: Every day | ORAL | Status: DC

## 2012-12-23 ENCOUNTER — Emergency Department (HOSPITAL_COMMUNITY)
Admission: EM | Admit: 2012-12-23 | Discharge: 2012-12-23 | Disposition: A | Discharge status: Home / Self Care | Payer: BC Managed Care – PPO | Attending: Emergency Medicine | Admitting: Emergency Medicine

## 2012-12-23 ENCOUNTER — Encounter (HOSPITAL_COMMUNITY): Payer: Self-pay

## 2012-12-23 DIAGNOSIS — K219 Gastro-esophageal reflux disease without esophagitis: Secondary | ICD-10-CM | POA: Insufficient documentation

## 2012-12-23 DIAGNOSIS — Z79899 Other long term (current) drug therapy: Secondary | ICD-10-CM | POA: Insufficient documentation

## 2012-12-23 DIAGNOSIS — Z7982 Long term (current) use of aspirin: Secondary | ICD-10-CM | POA: Insufficient documentation

## 2012-12-23 DIAGNOSIS — R0789 Other chest pain: Secondary | ICD-10-CM

## 2012-12-23 DIAGNOSIS — E785 Hyperlipidemia, unspecified: Secondary | ICD-10-CM | POA: Insufficient documentation

## 2012-12-23 DIAGNOSIS — E876 Hypokalemia: Secondary | ICD-10-CM | POA: Insufficient documentation

## 2012-12-23 DIAGNOSIS — R071 Chest pain on breathing: Secondary | ICD-10-CM | POA: Insufficient documentation

## 2012-12-23 DIAGNOSIS — I10 Essential (primary) hypertension: Secondary | ICD-10-CM | POA: Insufficient documentation

## 2012-12-23 LAB — BASIC METABOLIC PANEL
CO2: 25 mEq/L (ref 19–32)
Chloride: 104 mEq/L (ref 96–112)
GFR calc Af Amer: >90 mL/min (ref >90)
Potassium: 3.3 mEq/L — ABNORMAL LOW (ref 3.5–5.1)

## 2012-12-23 LAB — CBC
Platelets: 159 10*3/uL (ref 150–400)
RBC: 5.85 MIL/uL — ABNORMAL HIGH (ref 4.22–5.81)
RDW: 16 % — ABNORMAL HIGH (ref 11.5–15.5)
WBC: 7.1 10*3/uL (ref 4.0–10.5)

## 2012-12-23 LAB — POCT I-STAT TROPONIN I: Troponin i, poc: 0.01 ng/mL (ref 0.00–0.08)

## 2012-12-23 MED ORDER — HYDROCODONE-ACETAMINOPHEN 5-325 MG PO TABS: 2.0000 | ORAL_TABLET | ORAL | Status: DC | PRN

## 2012-12-23 MED ORDER — IBUPROFEN 600 MG PO TABS: 600.0000 mg | ORAL_TABLET | Freq: Four times a day (QID) | ORAL | Status: DC | PRN

## 2012-12-23 NOTE — ED Notes (Signed)
Pt c/o non-radiating mid-sternum chest pain starting 0300 this am. Pt denies N/V/D, diaphoresis, dizziness, back/abd pain, or SOB

## 2012-12-23 NOTE — ED Provider Notes (Signed)
History     CSN: [id number]  Arrival date & time 12/23/12  1226   First MD Initiated Contact with Patient 12/23/12 1300      Chief Complaint  Patient presents with  . Chest Pain     HPI Pt c/o non-radiating mid-sternum chest pain starting 0300 this am. Pt denies N/V/D, diaphoresis, dizziness, back/abd pain, or SOB  Past Medical History  Diagnosis Date  . GERD (gastroesophageal reflux disease)   . Hypertension   . Hyperlipidemia     History reviewed. No pertinent past surgical history.  History reviewed. No pertinent family history.  History  Substance Use Topics  . Smoking status: Never Smoker   . Smokeless tobacco: Not on file  . Alcohol Use: No      Review of Systems All other systems reviewed and are negative Allergies  Chloroquine  Home Medications   Current Outpatient Rx  Name  Route  Sig  Dispense  Refill  . aspirin EC 81 MG tablet   Oral   Take 81 mg by mouth daily.         Marland Kitchen losartan (COZAAR) 25 MG tablet   Oral   Take 25 mg by mouth daily.         Marland Kitchen omeprazole (PRILOSEC) 20 MG capsule   Oral   Take 20 mg by mouth daily.         . simvastatin (ZOCOR) 40 MG tablet   Oral   Take 40 mg by mouth every evening.         Marland Kitchen HYDROcodone-acetaminophen (NORCO/VICODIN) 5-325 MG per tablet   Oral   Take 2 tablets by mouth every 4 (four) hours as needed for pain.   10 tablet   0   . ibuprofen (ADVIL,MOTRIN) 600 MG tablet   Oral   Take 1 tablet (600 mg total) by mouth every 6 (six) hours as needed for pain.   30 tablet   0     BP 144/76  Pulse 73  Temp(Src) 98.6 F (37 C) (Oral)  Resp 19  Ht 5\' 11"  (1.803 m)  Wt 195 lb (88.451 kg)  BMI 27.21 kg/m2  SpO2 99%  Physical Exam  Nursing note and vitals reviewed. Constitutional: He is oriented to person, place, and time. He appears well-developed and well-nourished. No distress.  HENT:  Head: Normocephalic and atraumatic.  Eyes: Pupils are equal, round, and reactive to light.    Neck: Normal range of motion.  Cardiovascular: Normal rate and intact distal pulses.   Pulmonary/Chest: No respiratory distress.    Area of reproducible pain to palpation and twisting.  Abdominal: Normal appearance. He exhibits no distension.  Musculoskeletal: Normal range of motion.  Neurological: He is alert and oriented to person, place, and time. No cranial nerve deficit.  Skin: Skin is warm and dry. No rash noted.  Psychiatric: He has a normal mood and affect. His behavior is normal.    ED Course  Procedures (including critical care time)  Date: 12/23/2012  Rate: 99  Rhythm: normal sinus rhythm  QRS Axis: normal  Intervals: normal  ST/T Wave abnormalities: normal  Conduction Disutrbances: none  Narrative Interpretation: unremarkable     Labs Reviewed  CBC - Abnormal; Notable for the following:    RBC 5.85 (*)    MCV 67.9 (*)    MCH 23.1 (*)    RDW 16.0 (*)    All other components within normal limits  BASIC METABOLIC PANEL - Abnormal; Notable for the following:  Potassium 3.3 (*)    Glucose, Bld 104 (*)    GFR calc non Af Amer 83 (*)    All other components within normal limits  POCT I-STAT TROPONIN I   No results found.   1. Chest wall pain   2. Hypokalemia       MDM          Nelia Shi, MD 12/23/12 [phone]

## 2012-12-29 ENCOUNTER — Encounter: Payer: Self-pay | Admitting: Family Medicine

## 2013-05-05 ENCOUNTER — Encounter: Payer: Self-pay | Admitting: Radiology

## 2013-05-05 DIAGNOSIS — R0789 Other chest pain: Secondary | ICD-10-CM

## 2013-10-28 ENCOUNTER — Other Ambulatory Visit: Payer: Self-pay | Admitting: Physician Assistant

## 2013-10-29 ENCOUNTER — Ambulatory Visit (INDEPENDENT_AMBULATORY_CARE_PROVIDER_SITE_OTHER): Payer: BC Managed Care – PPO | Admitting: Family Medicine

## 2013-10-29 ENCOUNTER — Other Ambulatory Visit: Payer: Self-pay | Admitting: Family Medicine

## 2013-10-29 VITALS — BP 118/80 | HR 82 | Temp 98.7°F | Resp 18 | Ht 72.0 in | Wt 196.0 lb

## 2013-10-29 DIAGNOSIS — R079 Chest pain, unspecified: Secondary | ICD-10-CM

## 2013-10-29 MED ORDER — IBUPROFEN 600 MG PO TABS: 600.0000 mg | ORAL_TABLET | Freq: Four times a day (QID) | ORAL | Status: DC | PRN

## 2013-10-29 MED ORDER — SUCRALFATE 1 G PO TABS: 1.0000 g | ORAL_TABLET | Freq: Three times a day (TID) | ORAL | Status: DC

## 2013-10-29 NOTE — Progress Notes (Signed)
° °  Subjective:    Patient ID: Donald Wyatt, male    DOB: 05-Sep-1966, 48 y.o.   MRN: [id number] HPI  This chart was scribed for Kenyon AnaKurt Lauenstein-MD by Smiley HousemanFallon Davis, Scribe. This patient was seen in room 1 and the patient's care was started at 10:52 AM.  HPI Comments: Donald PionBobby K Kapral is a 48 y.o. male with a h/o of GERD who presents to the [facility] complaining of intermittent chest pain that started about 1 week ago.  He states that when he stretches he will sometimes feel a sharp pain in the middle of his chest.  Pt states that the chest pain will sometimes onset after he eats certain foods.  Pt denies pain during the night, but states that he will occasionally wake with acid reflux.  Pt denies swelling in his legs.  Pt states that he saw Dr. Shon BatonGanjii for routine cardiology procedures.  Pt is currently a senior at [employer].  He plans to graduate next semester with hopes for law school.  Pt is originally from [place].     History reviewed. No pertinent past surgical history.  History reviewed. No pertinent family history.  History   Social History   Marital Status: Single    Spouse Name: N/A    Number of Children: N/A   Years of Education: N/A   Occupational History   Not on file.   Social History Main Topics   Smoking status: Never Smoker    Smokeless tobacco: Not on file   Alcohol Use: No   Drug Use: Not on file   Sexual Activity: Not on file   Other Topics Concern   Not on file   Social History Narrative   No narrative on file    Allergies  Allergen Reactions   Chloroquine Itching    Patient Active Problem List   Diagnosis Date Noted   Other and unspecified hyperlipidemia 12/06/2012   Unspecified essential hypertension 12/06/2012   Atypical chest pain 07/13/2012   GERD (gastroesophageal reflux disease) 12/15/2011   Review of Systems  Constitutional: Negative for fever and chills.  HENT: Negative for congestion,  rhinorrhea and sore throat.   Respiratory: Negative for cough and shortness of breath.   Cardiovascular: Positive for chest pain. Negative for leg swelling.  Gastrointestinal: Negative for nausea, vomiting, abdominal pain and diarrhea.  Musculoskeletal: Negative for back pain.  Skin: Negative for color change and rash.  Neurological: Negative for syncope.       Objective:   Physical Exam Acute distress HEENT: Unremarkable Neck: Supple no adenopathy Chest: Clear Heart: Regular no murmur Palpation of chest and axilla is unremarkable Abdomen is soft nontender without HSM EKG shows some ST changes in leads V1 through V3 which are nonspecific Triage Vitals: BP 118/80   Pulse 82   Temp(Src) 98.7 F (37.1 C) (Oral)   Resp 18   Ht 6' (1.829 m)   Wt 196 lb (88.905 kg)   BMI 26.58 kg/m2   SpO2 97%    Assessment & Plan:     Chest pain - Plan: EKG 12-Lead, sucralfate (CARAFATE) 1 G tablet, ibuprofen (ADVIL,MOTRIN) 600 MG tablet  Signed, Elvina SidleKurt Lauenstein, MD

## 2013-12-13 ENCOUNTER — Encounter: Payer: Self-pay | Admitting: Family Medicine

## 2014-01-11 ENCOUNTER — Emergency Department (HOSPITAL_COMMUNITY)
Admission: EM | Admit: 2014-01-11 | Discharge: 2014-01-12 | Disposition: A | Discharge status: Home / Self Care | Payer: BC Managed Care – PPO | Attending: Emergency Medicine | Admitting: Emergency Medicine

## 2014-01-11 ENCOUNTER — Ambulatory Visit (INDEPENDENT_AMBULATORY_CARE_PROVIDER_SITE_OTHER): Payer: BC Managed Care – PPO | Admitting: Emergency Medicine

## 2014-01-11 ENCOUNTER — Encounter (HOSPITAL_COMMUNITY): Payer: Self-pay | Admitting: Emergency Medicine

## 2014-01-11 ENCOUNTER — Emergency Department (HOSPITAL_COMMUNITY): Payer: BC Managed Care – PPO

## 2014-01-11 VITALS — BP 155/84 | HR 71 | Temp 98.6°F | Resp 16 | Ht 71.75 in | Wt 199.0 lb

## 2014-01-11 DIAGNOSIS — I10 Essential (primary) hypertension: Secondary | ICD-10-CM

## 2014-01-11 DIAGNOSIS — Z79899 Other long term (current) drug therapy: Secondary | ICD-10-CM | POA: Insufficient documentation

## 2014-01-11 DIAGNOSIS — K219 Gastro-esophageal reflux disease without esophagitis: Secondary | ICD-10-CM | POA: Insufficient documentation

## 2014-01-11 DIAGNOSIS — M7090 Unspecified soft tissue disorder related to use, overuse and pressure of unspecified site: Secondary | ICD-10-CM

## 2014-01-11 DIAGNOSIS — R0602 Shortness of breath: Secondary | ICD-10-CM | POA: Insufficient documentation

## 2014-01-11 DIAGNOSIS — Z7982 Long term (current) use of aspirin: Secondary | ICD-10-CM | POA: Insufficient documentation

## 2014-01-11 DIAGNOSIS — R0789 Other chest pain: Secondary | ICD-10-CM | POA: Insufficient documentation

## 2014-01-11 DIAGNOSIS — R079 Chest pain, unspecified: Secondary | ICD-10-CM

## 2014-01-11 LAB — CBC WITH DIFFERENTIAL/PLATELET
BASOS PCT: 1 % (ref 0–1)
Basophils Absolute: 0.1 10*3/uL (ref 0.0–0.1)
EOS PCT: 5 % (ref 0–5)
Eosinophils Absolute: 0.3 10*3/uL (ref 0.0–0.7)
HEMATOCRIT: 37.2 % — AB (ref 39.0–52.0)
HEMOGLOBIN: 12.5 g/dL — AB (ref 13.0–17.0)
LYMPHS ABS: 2.4 10*3/uL (ref 0.7–4.0)
Lymphocytes Relative: 40 % (ref 12–46)
MCH: 22.6 pg — AB (ref 26.0–34.0)
MCHC: 33.6 g/dL (ref 30.0–36.0)
MCV: 67.3 fL — AB (ref 78.0–100.0)
MONO ABS: 0.5 10*3/uL (ref 0.1–1.0)
MONOS PCT: 8 % (ref 3–12)
Neutro Abs: 2.8 10*3/uL (ref 1.7–7.7)
Neutrophils Relative %: 46 % (ref 43–77)
Platelets: 156 10*3/uL (ref 150–400)
RBC: 5.53 MIL/uL (ref 4.22–5.81)
RDW: 16.4 % — ABNORMAL HIGH (ref 11.5–15.5)
WBC: 6.1 10*3/uL (ref 4.0–10.5)

## 2014-01-11 LAB — I-STAT TROPONIN, ED
Troponin i, poc: 0 ng/mL (ref 0.00–0.08)
Troponin i, poc: 0 ng/mL (ref 0.00–0.08)

## 2014-01-11 LAB — BASIC METABOLIC PANEL
BUN: 17 mg/dL (ref 6–23)
CALCIUM: 9.5 mg/dL (ref 8.4–10.5)
CHLORIDE: 100 meq/L (ref 96–112)
CO2: 25 meq/L (ref 19–32)
CREATININE: 1.06 mg/dL (ref 0.50–1.35)
GFR calc Af Amer: >90 mL/min (ref >90)
GFR calc non Af Amer: 82 mL/min — ABNORMAL LOW (ref >90)
GLUCOSE: 134 mg/dL — AB (ref 70–99)
Potassium: 4 mEq/L (ref 3.7–5.3)
Sodium: 137 mEq/L (ref 137–147)

## 2014-01-11 MED ORDER — ASPIRIN 325 MG PO TABS
325.0000 mg | ORAL_TABLET | Freq: Once | ORAL | Status: AC
  Administered 2014-01-11: 325 mg via ORAL
  Filled 2014-01-11: qty 1

## 2014-01-11 NOTE — ED Provider Notes (Signed)
CSN: [id number]     Arrival date & time 01/11/14  1719 History   First MD Initiated Contact with Patient 01/11/14 1730     Chief Complaint  Patient presents with  . Chest Pain  . Shortness of Breath     (Consider location/radiation/quality/duration/timing/severity/associated sxs/prior Treatment) HPI 48 year old male with no history of coronary artery disease this morning at less than 1 minute of some vague chest pressure when he was brushing teeth but was able to work all day without difficulty and after work when he was walking to his car this afternoon he had a few minutes of shortness of breath without any chest discomfort whatsoever, the shortness of breath resolved since he was in his car driving, he is no fever no cough no lightheadedness no syncope no abdominal pain no back pain no bloody stools no weakness or numbness no other concerns. There is no treatment prior to arrival. He is no recent history of trauma or immobilization. PERC negative. Past Medical History  Diagnosis Date  . GERD (gastroesophageal reflux disease)   . Hypertension   . Hyperlipidemia    History reviewed. No pertinent past surgical history. No family history on file. History  Substance Use Topics  . Smoking status: Never Smoker   . Smokeless tobacco: Not on file  . Alcohol Use: No    Review of Systems 10 Systems reviewed and are negative for acute change except as noted in the HPI.   Allergies  Chloroquine  Home Medications   Prior to Admission medications   Medication Sig Start Date End Date Taking? Authorizing Provider  aspirin EC 81 MG tablet Take 81 mg by mouth daily.    Historical Provider, MD  atorvastatin (LIPITOR) 40 MG tablet Take 40 mg by mouth daily.    Historical Provider, MD  ibuprofen (ADVIL,MOTRIN) 600 MG tablet Take 1 tablet (600 mg total) by mouth every 6 (six) hours as needed. 10/29/13   Elvina SidleKurt Lauenstein, MD  omeprazole (PRILOSEC) 20 MG capsule Take 20 mg by mouth daily.     Historical Provider, MD  sucralfate (CARAFATE) 1 G tablet Take 1 tablet (1 g total) by mouth 4 (four) times daily -  with meals and at bedtime. 10/29/13   Elvina SidleKurt Lauenstein, MD   BP 121/82  Pulse 63  Temp(Src) 98 F (36.7 C) (Oral)  Resp 16  SpO2 99% Physical Exam  Nursing note and vitals reviewed. Constitutional:  Awake, alert, nontoxic appearance.  HENT:  Head: Atraumatic.  Eyes: Right eye exhibits no discharge. Left eye exhibits no discharge.  Neck: Neck supple.  Cardiovascular: Normal rate and regular rhythm.   No murmur heard. Pulmonary/Chest: Effort normal and breath sounds normal. No respiratory distress. He has no wheezes. He has no rales. He exhibits no tenderness.  Abdominal: Soft. There is no tenderness. There is no rebound.  Musculoskeletal: He exhibits no edema and no tenderness.  Baseline ROM, no obvious new focal weakness.  Neurological: He is alert.  Mental status and motor strength appears baseline for patient and situation.  Skin: No rash noted.  Psychiatric: He has a normal mood and affect.    ED Course  Procedures (including critical care time) Patient understand and agree with initial ED impression and plan with expectations set for ED visit. Pt stable in ED with no significant deterioration in condition. Patient informed of clinical course, understand medical decision-making process, and agree with plan. Labs Review Labs Reviewed  CBC WITH DIFFERENTIAL - Abnormal; Notable for the following:  Hemoglobin 12.5 (*)    HCT 37.2 (*)    MCV 67.3 (*)    MCH 22.6 (*)    RDW 16.4 (*)    All other components within normal limits  BASIC METABOLIC PANEL - Abnormal; Notable for the following:    Glucose, Bld 134 (*)    GFR calc non Af Amer 82 (*)    All other components within normal limits  I-STAT TROPOININ, ED  I-STAT TROPOININ, ED  I-STAT TROPOININ, ED    Imaging Review No results found.   EKG Interpretation   Date/Time:  Thursday January 11 2014  17:29:38 EDT Ventricular Rate:  81 PR Interval:  171 QRS Duration: 84 QT Interval:  492 QTC Calculation: 571 R Axis:   20 Text Interpretation:  Sinus rhythm Borderline T abnormalities, anterior  leads Prolonged QT interval No significant change since last tracing  Confirmed by [facility]  MD, Jonny RuizJOHN ([id number]) on 01/11/2014 5:53:07 PM      MDM   Final diagnoses:  Chest pain    I doubt any other EMC precluding discharge at this time including, but not necessarily limited to the following:AMI, PE.    Hurman HornJohn M Royer Cristobal, MD 01/15/14 2228

## 2014-01-11 NOTE — Progress Notes (Signed)
[facility] [address], [place] [address] [phone]  Date:  01/11/2014   Name:  Donald PionBobby K Figgs   DOB:  06-15-1966   MRN:  [id number]  PCP:  Default, Provider, MD    Chief Complaint: Chest Pain, Shortness of Breath and Hyperlipidemia   History of Present Illness:  Donald PionBobby K Veloso is a 48 y.o. very pleasant male patient who presents with the following:  Chest pain yesterday while brushing his teeth for 10 seconds.  No further pain yesterday.  Today while driving had pain that he describes as a hard pressure.  No radiation.  No nausea or vomiting no diaphoresis or sensation of rapid or irregular heart beat.  Has been worked up in past with a hospitalization and ETT which was normal. History of hyperlipidemia and hypertension.  Non smoker. Has GERD but resolved on nexium partially and completely on omeprazole.  Says this pain is different that what he experiences with his GERD.  Still having pain now but less severe (since last 1:45).  No improvement with over the counter medications or other home remedies. Denies other complaint or health concern today.   Patient Active Problem List   Diagnosis Date Noted  . Other and unspecified hyperlipidemia 12/06/2012  . Unspecified essential hypertension 12/06/2012  . Atypical chest pain 07/13/2012  . GERD (gastroesophageal reflux disease) 12/15/2011    Past Medical History  Diagnosis Date  . GERD (gastroesophageal reflux disease)   . Hypertension   . Hyperlipidemia     No past surgical history on file.  History  Substance Use Topics  . Smoking status: Never Smoker   . Smokeless tobacco: Not on file  . Alcohol Use: No    No family history on file.  Allergies  Allergen Reactions  . Chloroquine Itching    Medication list has been reviewed and updated.  Current Outpatient Prescriptions on File Prior to Visit  Medication Sig Dispense Refill  . aspirin EC 81 MG tablet Take 81 mg by mouth daily.      Marland Kitchen.  atorvastatin (LIPITOR) 40 MG tablet Take 40 mg by mouth daily.      Marland Kitchen. ibuprofen (ADVIL,MOTRIN) 600 MG tablet Take 1 tablet (600 mg total) by mouth every 6 (six) hours as needed.  30 tablet  10  . omeprazole (PRILOSEC) 20 MG capsule Take 20 mg by mouth daily.      . sucralfate (CARAFATE) 1 G tablet Take 1 tablet (1 g total) by mouth 4 (four) times daily -  with meals and at bedtime.  90 tablet  1   No current facility-administered medications on file prior to visit.    Review of Systems:  As per HPI, otherwise negative.    Physical Examination: Filed Vitals:   01/11/14 1625  BP: 155/84  Pulse: 71  Temp: 98.6 F (37 C)  Resp: 16   Filed Vitals:   01/11/14 1625  Height: 5' 11.75" (1.822 m)  Weight: 199 lb (90.266 kg)   Body mass index is 27.19 kg/(m^2). Ideal Body Weight: Weight in (lb) to have BMI = 25: 182.7  GEN: WDWN, NAD, Non-toxic, A & O x 3 HEENT: Atraumatic, Normocephalic. Neck supple. No masses, No LAD. Ears and Nose: No external deformity. CV: RRR, No M/G/R. No JVD. No thrill. No extra heart sounds. PULM: CTA B, no wheezes, crackles, rhonchi. No retractions. No resp. distress. No accessory muscle use. ABD: S, NT, ND, +BS. No rebound. No HSM. EXTR: No c/c/e NEURO  Normal gait.  PSYCH: Normally interactive. Conversant. Not depressed or anxious appearing.  Calm demeanor.    Assessment and Plan: Chest pain Hypertension (borderline) Hyperlipidemia Refuses EMS Referred to ER.   Signed,  Phillips OdorJeffery Izsak Meir, MD

## 2014-01-11 NOTE — ED Notes (Signed)
Scheduled labs drawn and in process Patient and pt's family deny complaints or needs at this time Patient in NAD Side rails up, call bell in reach

## 2014-01-11 NOTE — ED Notes (Signed)
Pt A+Ox4, presents via triage with c/o episode central CP this AM while getting ready for work, pt reports subsequent episode "couldn't catch my breath".  Pt reports CP was "mild", nonradiating, not reproducible, with no associated symptoms, self resolving after approx 1 minute.  Pt reports SOB was also self resolving and not associated with any other symptoms.  Pt denies other complaints.  Pt is well appearing.  Denies all complaints at this time.  Pt reports went to work all day today and went to urgent care after work, told to come to ER by urgent care MD.  Skin PWD.  Speaking full/clear sentences, rr even/un-lab.  MAEI, ambulatory with steady gait.  SR on monitor, no ectopy noted at this time.  Changed to hospital gown, placed to cardiac/02 monitor.  NAD.

## 2014-01-11 NOTE — Patient Instructions (Signed)
Chest Pain (Nonspecific) °It is often hard to give a specific diagnosis for the cause of chest pain. There is always a chance that your pain could be related to something serious, such as a heart attack or a blood clot in the lungs. You need to follow up with your caregiver for further evaluation. °CAUSES  °· Heartburn. °· Pneumonia or bronchitis. °· Anxiety or stress. °· Inflammation around your heart (pericarditis) or lung (pleuritis or pleurisy). °· A blood clot in the lung. °· A collapsed lung (pneumothorax). It can develop suddenly on its own (spontaneous pneumothorax) or from injury (trauma) to the chest. °· Shingles infection (herpes zoster virus). °The chest wall is composed of bones, muscles, and cartilage. Any of these can be the source of the pain. °· The bones can be bruised by injury. °· The muscles or cartilage can be strained by coughing or overwork. °· The cartilage can be affected by inflammation and become sore (costochondritis). °DIAGNOSIS  °Lab tests or other studies, such as X-rays, electrocardiography, stress testing, or cardiac imaging, may be needed to find the cause of your pain.  °TREATMENT  °· Treatment depends on what may be causing your chest pain. Treatment may include: °· Acid blockers for heartburn. °· Anti-inflammatory medicine. °· Pain medicine for inflammatory conditions. °· Antibiotics if an infection is present. °· You may be advised to change lifestyle habits. This includes stopping smoking and avoiding alcohol, caffeine, and chocolate. °· You may be advised to keep your head raised (elevated) when sleeping. This reduces the chance of acid going backward from your stomach into your esophagus. °· Most of the time, nonspecific chest pain will improve within 2 to 3 days with rest and mild pain medicine. °HOME CARE INSTRUCTIONS  °· If antibiotics were prescribed, take your antibiotics as directed. Finish them even if you start to feel better. °· For the next few days, avoid physical  activities that bring on chest pain. Continue physical activities as directed. °· Do not smoke. °· Avoid drinking alcohol. °· Only take over-the-counter or prescription medicine for pain, discomfort, or fever as directed by your caregiver. °· Follow your caregiver's suggestions for further testing if your chest pain does not go away. °· Keep any follow-up appointments you made. If you do not go to an appointment, you could develop lasting (chronic) problems with pain. If there is any problem keeping an appointment, you must call to reschedule. °SEEK MEDICAL CARE IF:  °· You think you are having problems from the medicine you are taking. Read your medicine instructions carefully. °· Your chest pain does not go away, even after treatment. °· You develop a rash with blisters on your chest. °SEEK IMMEDIATE MEDICAL CARE IF:  °· You have increased chest pain or pain that spreads to your arm, neck, jaw, back, or abdomen. °· You develop shortness of breath, an increasing cough, or you are coughing up blood. °· You have severe back or abdominal pain, feel nauseous, or vomit. °· You develop severe weakness, fainting, or chills. °· You have a fever. °THIS IS AN EMERGENCY. Do not wait to see if the pain will go away. Get medical help at once. Call your local emergency services (911 in [place] Do not drive yourself to the hospital. °MAKE SURE YOU:  °· Understand these instructions. °· Will watch your condition. °· Will get help right away if you are not doing well or get worse. °Document Released: 06/24/2005 Document Revised: 12/07/2011 Document Reviewed: 04/19/2008 °ExitCare® Patient Information ©2014 [employer]   LL[employer]. ° °

## 2014-01-11 NOTE — ED Notes (Signed)
Pt to radiology.

## 2014-01-12 LAB — I-STAT TROPONIN, ED: Troponin i, poc: 0 ng/mL (ref 0.00–0.08)

## 2014-01-12 NOTE — Discharge Instructions (Signed)

## 2014-01-22 ENCOUNTER — Other Ambulatory Visit: Payer: Self-pay | Admitting: Family Medicine

## 2014-01-24 ENCOUNTER — Other Ambulatory Visit: Payer: Self-pay

## 2014-01-24 MED ORDER — ATORVASTATIN CALCIUM 40 MG PO TABS: 40.0000 mg | ORAL_TABLET | Freq: Every day | ORAL | Status: DC

## 2014-04-03 ENCOUNTER — Ambulatory Visit (INDEPENDENT_AMBULATORY_CARE_PROVIDER_SITE_OTHER): Payer: BC Managed Care – PPO | Admitting: Family Medicine

## 2014-04-03 VITALS — BP 137/86 | HR 79 | Temp 98.6°F | Resp 18 | Ht 72.0 in | Wt 202.0 lb

## 2014-04-03 DIAGNOSIS — D649 Anemia, unspecified: Secondary | ICD-10-CM

## 2014-04-03 DIAGNOSIS — K2100 Gastro-esophageal reflux disease with esophagitis, without bleeding: Secondary | ICD-10-CM

## 2014-04-03 DIAGNOSIS — Z72820 Sleep deprivation: Secondary | ICD-10-CM

## 2014-04-03 DIAGNOSIS — R5383 Other fatigue: Secondary | ICD-10-CM

## 2014-04-03 DIAGNOSIS — R079 Chest pain, unspecified: Secondary | ICD-10-CM

## 2014-04-03 DIAGNOSIS — R5381 Other malaise: Secondary | ICD-10-CM

## 2014-04-03 LAB — COMPREHENSIVE METABOLIC PANEL
ALBUMIN: 4.5 g/dL (ref 3.5–5.2)
ALT: 17 U/L (ref 0–53)
AST: 16 U/L (ref 0–37)
Alkaline Phosphatase: 67 U/L (ref 39–117)
BILIRUBIN TOTAL: 0.6 mg/dL (ref 0.2–1.2)
BUN: 16 mg/dL (ref 6–23)
CALCIUM: 9.1 mg/dL (ref 8.4–10.5)
CO2: 24 meq/L (ref 19–32)
Chloride: 105 mEq/L (ref 96–112)
Creat: 1.09 mg/dL (ref 0.50–1.35)
GLUCOSE: 109 mg/dL — AB (ref 70–99)
Potassium: 3.9 mEq/L (ref 3.5–5.3)
SODIUM: 139 meq/L (ref 135–145)
Total Protein: 7.9 g/dL (ref 6.0–8.3)

## 2014-04-03 LAB — RETICULOCYTES
ABS Retic: 101.2 10*3/uL (ref 19.0–186.0)
RBC.: 5.95 MIL/uL — ABNORMAL HIGH (ref 4.22–5.81)
RETIC CT PCT: 1.7 % (ref 0.4–2.3)

## 2014-04-03 LAB — CBC
HCT: 40 % (ref 39.0–52.0)
Hemoglobin: 13.3 g/dL (ref 13.0–17.0)
MCH: 22.4 pg — ABNORMAL LOW (ref 26.0–34.0)
MCHC: 33.3 g/dL (ref 30.0–36.0)
MCV: 67.2 fL — AB (ref 78.0–100.0)
Platelets: 153 10*3/uL (ref 150–400)
RBC: 5.95 MIL/uL — ABNORMAL HIGH (ref 4.22–5.81)
RDW: 18.5 % — AB (ref 11.5–15.5)
WBC: 6 10*3/uL (ref 4.0–10.5)

## 2014-04-03 LAB — FERRITIN: Ferritin: 32 ng/mL (ref 22–322)

## 2014-04-03 LAB — TSH: TSH: 0.514 u[IU]/mL (ref 0.350–4.500)

## 2014-04-03 LAB — RPR

## 2014-04-03 LAB — HIV ANTIBODY (ROUTINE TESTING W REFLEX): HIV 1&2 Ab, 4th Generation: NONREACTIVE

## 2014-04-03 LAB — H. PYLORI ANTIBODY, IGG: H Pylori IgG: 0.81 {ISR}

## 2014-04-03 MED ORDER — DEXLANSOPRAZOLE 60 MG PO CPDR: 60.0000 mg | DELAYED_RELEASE_CAPSULE | Freq: Every day | ORAL | Status: DC

## 2014-04-03 MED ORDER — ASPIRIN EC 81 MG PO TBEC: 81.0000 mg | DELAYED_RELEASE_TABLET | Freq: Every day | ORAL | Status: AC

## 2014-04-03 MED ORDER — ZALEPLON 5 MG PO CAPS: ORAL_CAPSULE | ORAL | Status: DC

## 2014-04-03 NOTE — Progress Notes (Signed)
Subjective: Patient is here complaining of fatigue. He just has no energy. He continues to have chest pains in the left side of his chest, tight sensation. He was worked up with cardiology testing a couple of years ago. He went to the emergency room the spring and was negative. He has heartburn. He has a history of anemia. He has never had an EGD. He does not exercise because of the chest pains. He's gained weight. He is a Runner, broadcasting/film/videoteacher, off for the summer. He stays up until about 2 AM, gets up 5 AM to send his wife to work, doesn't go back to bed. He grew in [place] until he was about 48 years old.  He feels like some of his symptoms came on after his wife went to [place] a few years ago, and he would like to include STD testings. She is a Water quality scientistphlebotomist.  Otherwise review of systems fairly unremarkable.  He a relative's Dexilant 60 and it helped.  Objective: No acute distress. Has gained some weight. TMs normal. Throat clear. Neck supple without nodes or thyromegaly. Chest clear. Heart regular without murmurs. Abdomen soft without masses or tenderness. Extremities normal. Has scars on his upper back from traditional medicine cutting  Assessment: Fatigue Sleep deprivation Chest GERD Weight gain  Plan: See instructions Cardiology clearance, regular exercise Check labs Probably needs an EGD Continue with the Dexilant until he gets checked out Chest pains GERD

## 2014-04-03 NOTE — Patient Instructions (Addendum)
Referral is being made to gastroenterology and cardiology for further assessment  Continue taking the Dexilant one daily for now.  When cleared by cardiologist try to resume more regular vigorous exercise.  Eat less to try to lose weight  Try hard to get more sleep  Take Sonata (zalelpion) when trying to go back to sleep  Return in 4-5 weeks for follow-up

## 2014-04-06 LAB — GC/CHLAMYDIA PROBE AMP
CT Probe RNA: NEGATIVE
GC Probe RNA: NEGATIVE

## 2014-04-09 ENCOUNTER — Other Ambulatory Visit: Payer: Self-pay | Admitting: *Deleted

## 2014-04-09 ENCOUNTER — Telehealth: Payer: Self-pay | Admitting: *Deleted

## 2014-04-09 DIAGNOSIS — Z72820 Sleep deprivation: Secondary | ICD-10-CM

## 2014-04-09 MED ORDER — ZALEPLON 5 MG PO CAPS: ORAL_CAPSULE | ORAL | Status: DC

## 2014-04-09 NOTE — Telephone Encounter (Signed)
Prior Auth for Dexilant 60mg  approved through covermymeds- [id number] until 04/10/15

## 2014-05-28 ENCOUNTER — Institutional Professional Consult (permissible substitution): Payer: BC Managed Care – PPO | Admitting: Cardiology

## 2014-05-29 ENCOUNTER — Institutional Professional Consult (permissible substitution): Payer: BC Managed Care – PPO | Admitting: Cardiology

## 2014-08-07 ENCOUNTER — Telehealth: Payer: Self-pay

## 2014-08-07 NOTE — Telephone Encounter (Signed)
PA needed for daily dosing of omeprazole. Completed on the phone and approved through 08/07/15, case # [id number]. Notified pharm.

## 2014-11-02 ENCOUNTER — Other Ambulatory Visit: Payer: Self-pay | Admitting: Family Medicine

## 2014-11-10 ENCOUNTER — Ambulatory Visit (INDEPENDENT_AMBULATORY_CARE_PROVIDER_SITE_OTHER): Payer: BC Managed Care – PPO | Admitting: Family Medicine

## 2014-11-10 VITALS — BP 110/80 | HR 70 | Temp 97.9°F | Resp 19 | Ht 71.75 in | Wt 200.2 lb

## 2014-11-10 DIAGNOSIS — K2100 Gastro-esophageal reflux disease with esophagitis, without bleeding: Secondary | ICD-10-CM

## 2014-11-10 DIAGNOSIS — Z7184 Encounter for health counseling related to travel: Secondary | ICD-10-CM

## 2014-11-10 DIAGNOSIS — M2550 Pain in unspecified joint: Secondary | ICD-10-CM

## 2014-11-10 DIAGNOSIS — Z7189 Other specified counseling: Secondary | ICD-10-CM

## 2014-11-10 DIAGNOSIS — Z0000 Encounter for general adult medical examination without abnormal findings: Secondary | ICD-10-CM

## 2014-11-10 DIAGNOSIS — F40243 Fear of flying: Secondary | ICD-10-CM

## 2014-11-10 DIAGNOSIS — R079 Chest pain, unspecified: Secondary | ICD-10-CM

## 2014-11-10 LAB — COMPREHENSIVE METABOLIC PANEL
ALBUMIN: 4.3 g/dL (ref 3.5–5.2)
ALT: 18 U/L (ref 0–53)
AST: 16 U/L (ref 0–37)
Alkaline Phosphatase: 56 U/L (ref 39–117)
BUN: 19 mg/dL (ref 6–23)
CO2: 27 meq/L (ref 19–32)
CREATININE: 1.03 mg/dL (ref 0.50–1.35)
Calcium: 9.7 mg/dL (ref 8.4–10.5)
Chloride: 104 mEq/L (ref 96–112)
GLUCOSE: 99 mg/dL (ref 70–99)
Potassium: 4.2 mEq/L (ref 3.5–5.3)
Sodium: 140 mEq/L (ref 135–145)
Total Bilirubin: 0.8 mg/dL (ref 0.2–1.2)
Total Protein: 8 g/dL (ref 6.0–8.3)

## 2014-11-10 LAB — CBC
HCT: 40 % (ref 39.0–52.0)
Hemoglobin: 13 g/dL (ref 13.0–17.0)
MCH: 22.3 pg — AB (ref 26.0–34.0)
MCHC: 32.5 g/dL (ref 30.0–36.0)
MCV: 68.6 fL — ABNORMAL LOW (ref 78.0–100.0)
Platelets: 200 10*3/uL (ref 150–400)
RBC: 5.83 MIL/uL — AB (ref 4.22–5.81)
RDW: 17.1 % — AB (ref 11.5–15.5)
WBC: 4.6 10*3/uL (ref 4.0–10.5)

## 2014-11-10 LAB — LIPID PANEL
CHOL/HDL RATIO: 3.1 ratio
Cholesterol: 220 mg/dL — ABNORMAL HIGH (ref 0–200)
HDL: 70 mg/dL (ref >39)
LDL Cholesterol: 142 mg/dL — ABNORMAL HIGH (ref 0–99)
Triglycerides: 39 mg/dL (ref <150)
VLDL: 8 mg/dL (ref 0–40)

## 2014-11-10 MED ORDER — CIPROFLOXACIN HCL 500 MG PO TABS: ORAL_TABLET | ORAL | Status: DC

## 2014-11-10 MED ORDER — IBUPROFEN 600 MG PO TABS: ORAL_TABLET | ORAL | Status: DC

## 2014-11-10 MED ORDER — SUCRALFATE 1 G PO TABS: 1.0000 g | ORAL_TABLET | Freq: Three times a day (TID) | ORAL | Status: DC

## 2014-11-10 MED ORDER — ALPRAZOLAM 0.5 MG PO TBDP: ORAL_TABLET | ORAL | Status: DC

## 2014-11-10 MED ORDER — ALPRAZOLAM 0.5 MG PO TABS: ORAL_TABLET | ORAL | Status: DC

## 2014-11-10 MED ORDER — ATOVAQUONE-PROGUANIL HCL 250-100 MG PO TABS: ORAL_TABLET | ORAL | Status: DC

## 2014-11-10 NOTE — Progress Notes (Signed)
Physical examination:  History: This is a 49 year old man previously known to me. He is getting ready to go back to [place] for about 6 weeks with his father who is moving there. His mother has cervical cancer in his sister has breast cancer so he wishes to get back and see them also. He will probably try and stay for about 6 weeks. He has not been having any major acute problems. Needs his immunizations reviewed.   He brought his yellow card with him. He has gotten typhoid, hep A and hep B, and yellow fever in 2013.In 2007 he had his tetanus Td vaccine  He has fears of flying, and would like something for the flight.  He would like travel post colectomy medications  Past medical history: Operations: None Major illnesses: None Regular medications: See list Allergies: See list  Social history: Married, 5 children including 2 sets of twins. Works as a Runner, broadcasting/film/videoteacher.  Family history: Mother lives in [place], father lives in the [place]. Father is getting ready to move back to [place]. No major familial diseases. However his mother does have cervical cancer and his sister has breast cancer  Review of systems: Constitutional: Unremarkable HEENT: Unremarkable Cardiovascular: Unremarkable Respiratory: Unremarkable Gastrointestinal: Some GERD GU: Unremarkable Musculoskeletal: Unremarkable Neurologic: Unremarkable Dermatologic: Unremarkable Psychiatric: Unremarkable Neurologic: Unremarkable Endocrine: Unremarkable  Physical exam: Well-developed man in no acute distress. TMs normal. Eyes PERRLA. Benign. Throat clear. Neck supple without nodes thyromegaly. No carotid bruits. Chest clear process. Heart regular without murmurs. And soft without mass or tenderness. Normal male external genitalia with testes descended. No hernias. Digital exam was attempted but was not possible since he was so tense. Extremities unremarkable. Skin unremarkable.  Assessment: Physical examination Travel  medicine Anxiety with fear of flying GERD    Plan: Travel medicine treatments reviewed and ordered. She is instructed sheets. Continue taking the Carafate for his GERD  Return as needed

## 2014-11-10 NOTE — Patient Instructions (Addendum)
Go to the [facility] website and look up traveler for [place] and read the information  [email or web address].cdc.gov/travel/destinations/traveler/[email or web address]_[email or web address]  Your immunizations appear to be satisfactory.  Always be careful with good water source  Take sufficient quantities of your medications with you  Continue taking the sucralfate 1 pill before meals and at bedtime as needed  In the event of traveler's diarrhea probably begin taking ciprofloxacin one pill twice daily. Continue for about 3-4 days.  In the event of anxiety from travel take the alprazolam one pill twice daily. Use only when needed.  Take the ibuprofen if needed for joint pains  Advise beginning to start getting some regular exercise by taking a daily walk.  Return if worse at anytime

## 2014-11-12 LAB — PSA: PSA: 0.28 ng/mL (ref <=4.00)

## 2014-11-16 ENCOUNTER — Encounter: Payer: Self-pay | Admitting: Family Medicine

## 2014-11-17 ENCOUNTER — Telehealth: Payer: Self-pay

## 2014-11-17 NOTE — Telephone Encounter (Signed)
Patient called in stating that he had saw Dr. Alwyn RenHopper this week and that he is going out of town for more than a month and that he needs more than a month supply which is what Dr. Alwyn RenHopper had given him to start with. He will not be back until 01/02/15 so he was wanting to know if could get more of the ALPRAZolam Prudy Feeler(XANAX) 0.5 MG tablet since he wont be back in a month like he originally thought.  He would like to be called back at [phone]

## 2014-11-17 NOTE — Telephone Encounter (Signed)
Has he filled the prescription that Dr. Alwyn RenHopper gave him? If not, please return it to us in exchange for a new prescription. If he has, we'll need to call and speak with the pharmacist to explain the situation.  It was written for 1 BID for anxiety during travel (#30).  How much does he think he'll need?

## 2014-11-19 ENCOUNTER — Telehealth: Payer: Self-pay

## 2014-11-19 NOTE — Telephone Encounter (Signed)
Pt of Dr. Elbert EwingsL states that he is in [place] and has forgotten his rx for Malaria, would like to know if Dr. Elbert EwingsL can reprint this, and give to his wife for pick up. Please advise

## 2014-11-19 NOTE — Telephone Encounter (Signed)
That was only intended for days that he is actually traveling. A generous amount was given for that. He does not need to take it regularly while in [place]. Only on the trips to and from.he may have Artie left for [place].

## 2014-11-21 NOTE — Telephone Encounter (Signed)
Patient called this morning to get a status update on his medication requests before he leaves for [place]. States that he called a week ago about this however his first message was actually documented on 11/17/2014, so he called 4 days ago. There are several phone messages going on about this issue. What is going on? Patient is angry. He states that he is leaving for [place] tomorrow and has not gotten a return phone call about his meds. Please advise.   [phone]

## 2014-11-21 NOTE — Telephone Encounter (Signed)
Ok, there are TWO DIFFERENT issues here:  1. He called 11/17/14 asking for more than #30 alprazolam tablets for travel anxiety.  Since it was meant for use only on the days he was actually travelling, rather than daily for the entire trip, this should be an adequate supply.  2. He called 2/22 stating that he forgot to get the prescription for malaria prevention.On 11/10/14, Dr. Alwyn RenHopper ordered malarone, 1 PO QD beginning 1 day before travel and continuing during travel and one week upon return.  The #60 was sent to his pharmacy, and should be plenty for his trip 2/25-4/16/16.  If his trip will be longer, I am happy to send in the additional tablets.  Please verify that the pharmacy received that Rx.

## 2014-11-21 NOTE — Telephone Encounter (Signed)
Read through past several messages. According to Dr. Alwyn RenHopper; who he saw and discussed this visit with; that pt was given 30 tablets for the days he was traveling only.  Donald Najjaravid H Hopper, MD at 11/19/2014 10:26 PM     Status: Signed       Expand All Collapse All   That was only intended for days that he is actually traveling. A generous amount was given for that. He does not need to take it regularly while in [place]. Only on the trips to and from.he may have Artie left for [place].       Please advise on situation.

## 2014-11-21 NOTE — Telephone Encounter (Signed)
Left message for pt to call back  °

## 2014-11-22 NOTE — Telephone Encounter (Signed)
If he needs longer course of malarone give enough for the trip plus 10 days.

## 2014-11-23 NOTE — Telephone Encounter (Signed)
Called pt, fax machine came on.

## 2015-05-30 ENCOUNTER — Ambulatory Visit (INDEPENDENT_AMBULATORY_CARE_PROVIDER_SITE_OTHER): Payer: BC Managed Care – PPO

## 2015-05-30 ENCOUNTER — Ambulatory Visit (INDEPENDENT_AMBULATORY_CARE_PROVIDER_SITE_OTHER): Payer: BC Managed Care – PPO | Admitting: Family Medicine

## 2015-05-30 ENCOUNTER — Telehealth: Payer: Self-pay

## 2015-05-30 VITALS — BP 132/82 | HR 71 | Temp 98.4°F | Resp 18 | Ht 72.0 in | Wt 201.0 lb

## 2015-05-30 DIAGNOSIS — Z23 Encounter for immunization: Secondary | ICD-10-CM

## 2015-05-30 DIAGNOSIS — R202 Paresthesia of skin: Secondary | ICD-10-CM

## 2015-05-30 DIAGNOSIS — Z7189 Other specified counseling: Secondary | ICD-10-CM

## 2015-05-30 DIAGNOSIS — Z7185 Encounter for immunization safety counseling: Secondary | ICD-10-CM

## 2015-05-30 NOTE — Telephone Encounter (Signed)
Requesting ibuprohen - [facility] on [address]   [phone]

## 2015-05-30 NOTE — Progress Notes (Signed)
Subjective:  This chart was scribed for Donald Sidle, MD by Broadus John, Medical Scribe. This patient was seen in Room 1 and the patient's care was started at 4:04 PM.   Patient ID: Donald Wyatt, male    DOB: [birth year], 49 y.o.   MRN: [id number]  Chief Complaint  Patient presents with   Leg Problem    pt states that he feels tingling in right leg    Shoulder Pain    right shoulder     HPI HPI Comments: Donald Wyatt is a 49 y.o. male who presents to [facility] complaining of a tingling sensation in the right leg, onset this morning.  Pt reports that the tingling sensation radiates to his feet. He also indicates having right shoulder pain that does not radiated to his arm or hand. Pt reports that he has full strength of his right hand. He notes that he has taken aspirin for the symptoms. Pt denies pain when rotating the neck, left shoulder pain, chest pain, or any traumatic events that might have caused his symptoms.   Pt is interested in taking the flu shot.  Pt reports that he is currently a senior, and plans to go to [place] after that. He reports that he has recently visited [place] 3 months ago.    Patient Active Problem List   Diagnosis Date Noted   Other and unspecified hyperlipidemia 12/06/2012   Unspecified essential hypertension 12/06/2012   Atypical chest pain 07/13/2012   GERD (gastroesophageal reflux disease) 12/15/2011   Past Medical History  Diagnosis Date   GERD (gastroesophageal reflux disease)    Hypertension    Hyperlipidemia    History reviewed. No pertinent past surgical history. Allergies  Allergen Reactions   Chloroquine Itching   Prior to Admission medications   Medication Sig Start Date End Date Taking? Authorizing Provider  aspirin EC 81 MG tablet Take 1 tablet (81 mg total) by mouth daily. 04/03/14  Yes Peyton Najjar, MD  ibuprofen (ADVIL,MOTRIN) 600 MG tablet Take 1 pill by mouth 3 times daily with  food only if needed for aching pain. 11/10/14  Yes Peyton Najjar, MD  sucralfate (CARAFATE) 1 G tablet Take 1 tablet (1 g total) by mouth 4 (four) times daily -  with meals and at bedtime. 11/10/14  Yes Peyton Najjar, MD  ALPRAZolam Prudy Feeler) 0.5 MG tablet Take one pill twice daily when needed for anxiety of traveling Patient not taking: Reported on 05/30/2015 11/10/14   Peyton Najjar, MD  atorvastatin (LIPITOR) 40 MG tablet Take 1 tablet (40 mg total) by mouth daily. Patient not taking: Reported on 11/10/2014 01/24/14   Carmelina Dane, MD  atovaquone-proguanil (MALARONE) 250-100 MG TABS Take 1 daily beginning 1 day before trip until 1 week after trip for malaria prevention 11/10/14   Peyton Najjar, MD  ciprofloxacin (CIPRO) 500 MG tablet Take one pill twice daily starting at onset of traveler's diarrhea. Continue for 3 or 4 days. Patient not taking: Reported on 05/30/2015 11/10/14   Peyton Najjar, MD  dexlansoprazole (DEXILANT) 60 MG capsule Take 1 capsule (60 mg total) by mouth daily. Patient not taking: Reported on 05/30/2015 04/03/14   Peyton Najjar, MD   Social History   Social History   Marital Status: Single    Spouse Name: N/A   Number of Children: N/A   Years of Education: N/A   Occupational History   Not on file.  Social History Main Topics   Smoking status: Never Smoker    Smokeless tobacco: Not on file   Alcohol Use: No   Drug Use: No   Sexual Activity: Not on file   Other Topics Concern   Not on file   Social History Narrative    Review of Systems  Cardiovascular: Negative for chest pain.  Musculoskeletal: Positive for arthralgias. Negative for neck pain and neck stiffness.       Objective:   Physical Exam  Constitutional: He is oriented to person, place, and time. He appears well-developed and well-nourished. No distress.  HENT:  Head: Normocephalic and atraumatic.  Eyes: EOM are normal. Pupils are equal, round, and reactive to light.  Neck: Neck  supple. No tracheal tenderness present. No thyromegaly present.   no bruits, no adenopathy.   Cardiovascular: Normal rate.   Pulmonary/Chest: Effort normal.  Musculoskeletal:  Good range of motion in the upper extremities. Reflexes are normal in the triceps and biceps regions.    Neurological: He is alert and oriented to person, place, and time. No cranial nerve deficit.  Skin: Skin is warm and dry.  Psychiatric: He has a normal mood and affect. His behavior is normal.  Nursing note and vitals reviewed.  [facility] (PRIMARY) x-ray report read by Dr. Elvina Wyatt: cervical spine x-ray happens to be normal.  BP 132/82 mmHg   Pulse 71   Temp(Src) 98.4 F (36.9 C) (Oral)   Resp 18   Ht 6' (1.829 m)   Wt 201 lb (91.173 kg)   BMI 27.25 kg/m2   SpO2 99%     Assessment & Plan:   This chart was scribed in my presence and reviewed by me personally.    ICD-9-CM ICD-10-CM   1. Paresthesias 782.0 R20.2 DG Cervical Spine 2 or 3 views  2. Immunization counseling V65.49 Z71.89 Flu Vaccine QUAD 36+ mos IM     Signed, Donald Sidle, MD

## 2015-05-31 ENCOUNTER — Other Ambulatory Visit: Payer: Self-pay

## 2015-05-31 DIAGNOSIS — M2550 Pain in unspecified joint: Secondary | ICD-10-CM

## 2015-05-31 MED ORDER — IBUPROFEN 600 MG PO TABS
ORAL_TABLET | ORAL | Status: DC
Start: 2015-05-31 — End: 2016-04-26

## 2015-09-14 IMAGING — CR DG CERVICAL SPINE 2 OR 3 VIEWS
3 series · 3 of 3 positions shown · non-contrast
Comparison: None.

CLINICAL DATA: Right-sided numbness and tingling in neck and
shoulder.

EXAM:
CERVICAL SPINE - 2-3 VIEW

[lateral]
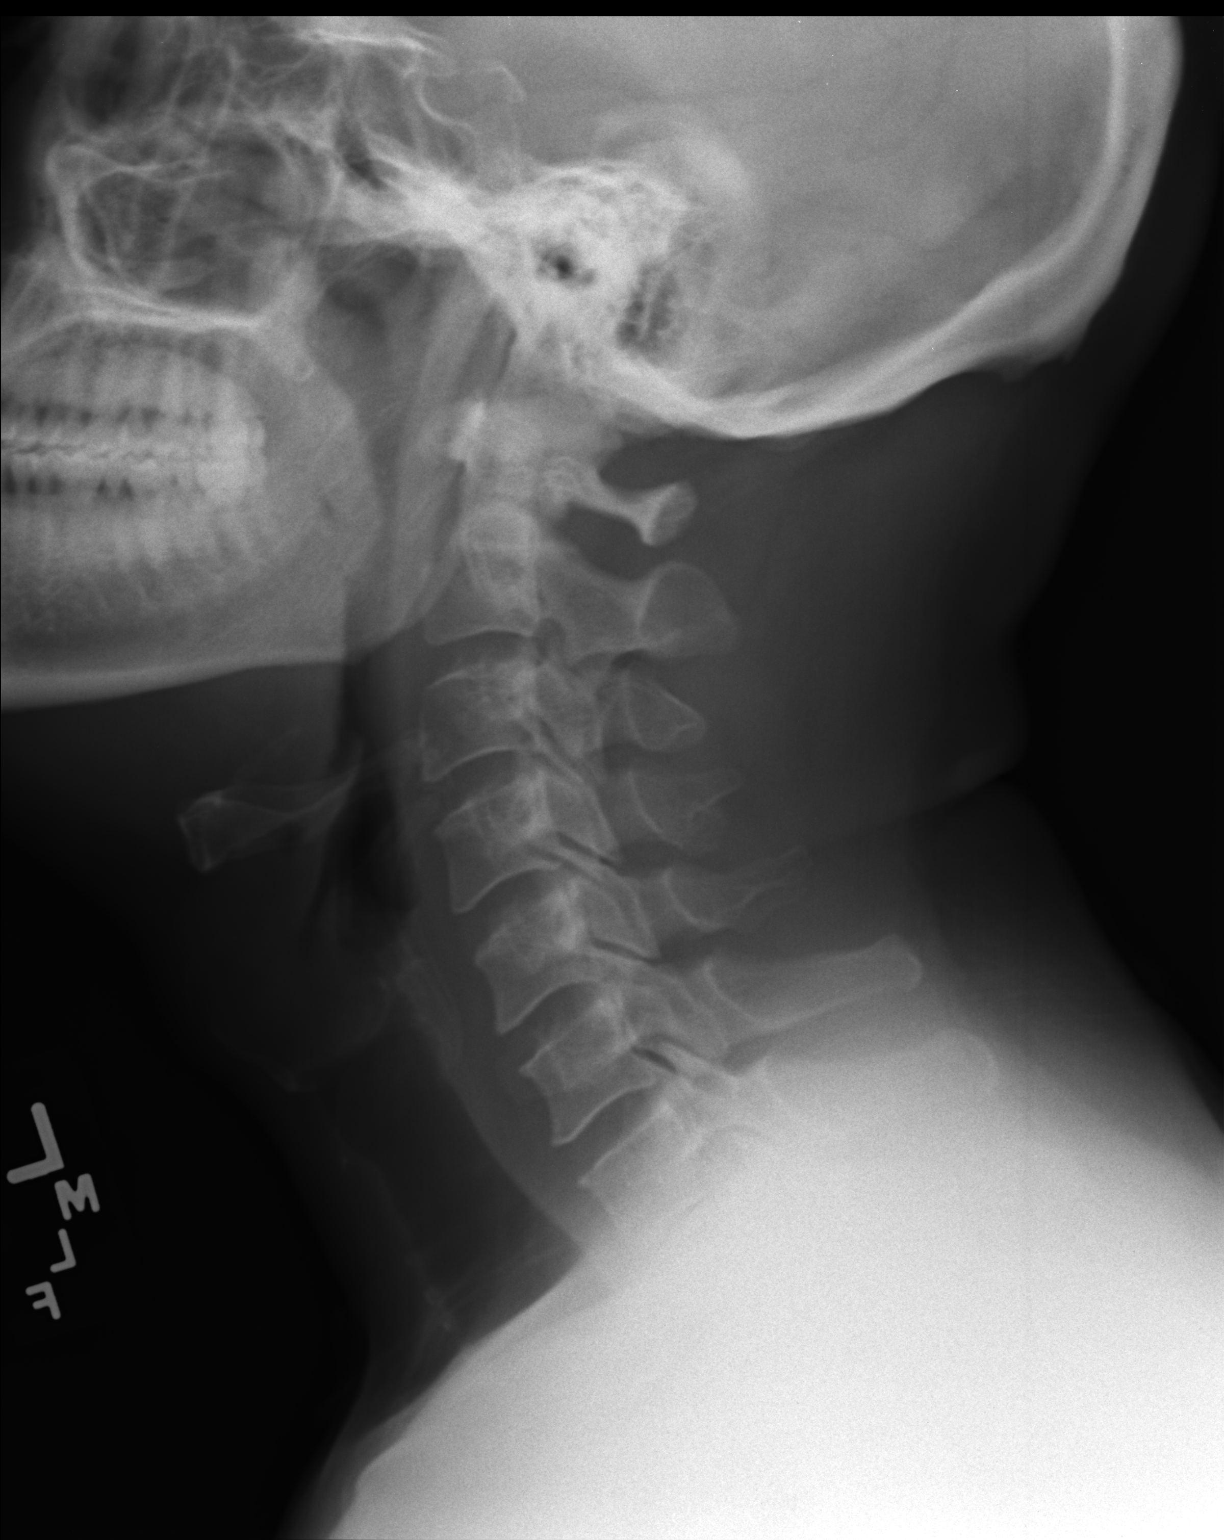

[AP (1 of 2)]
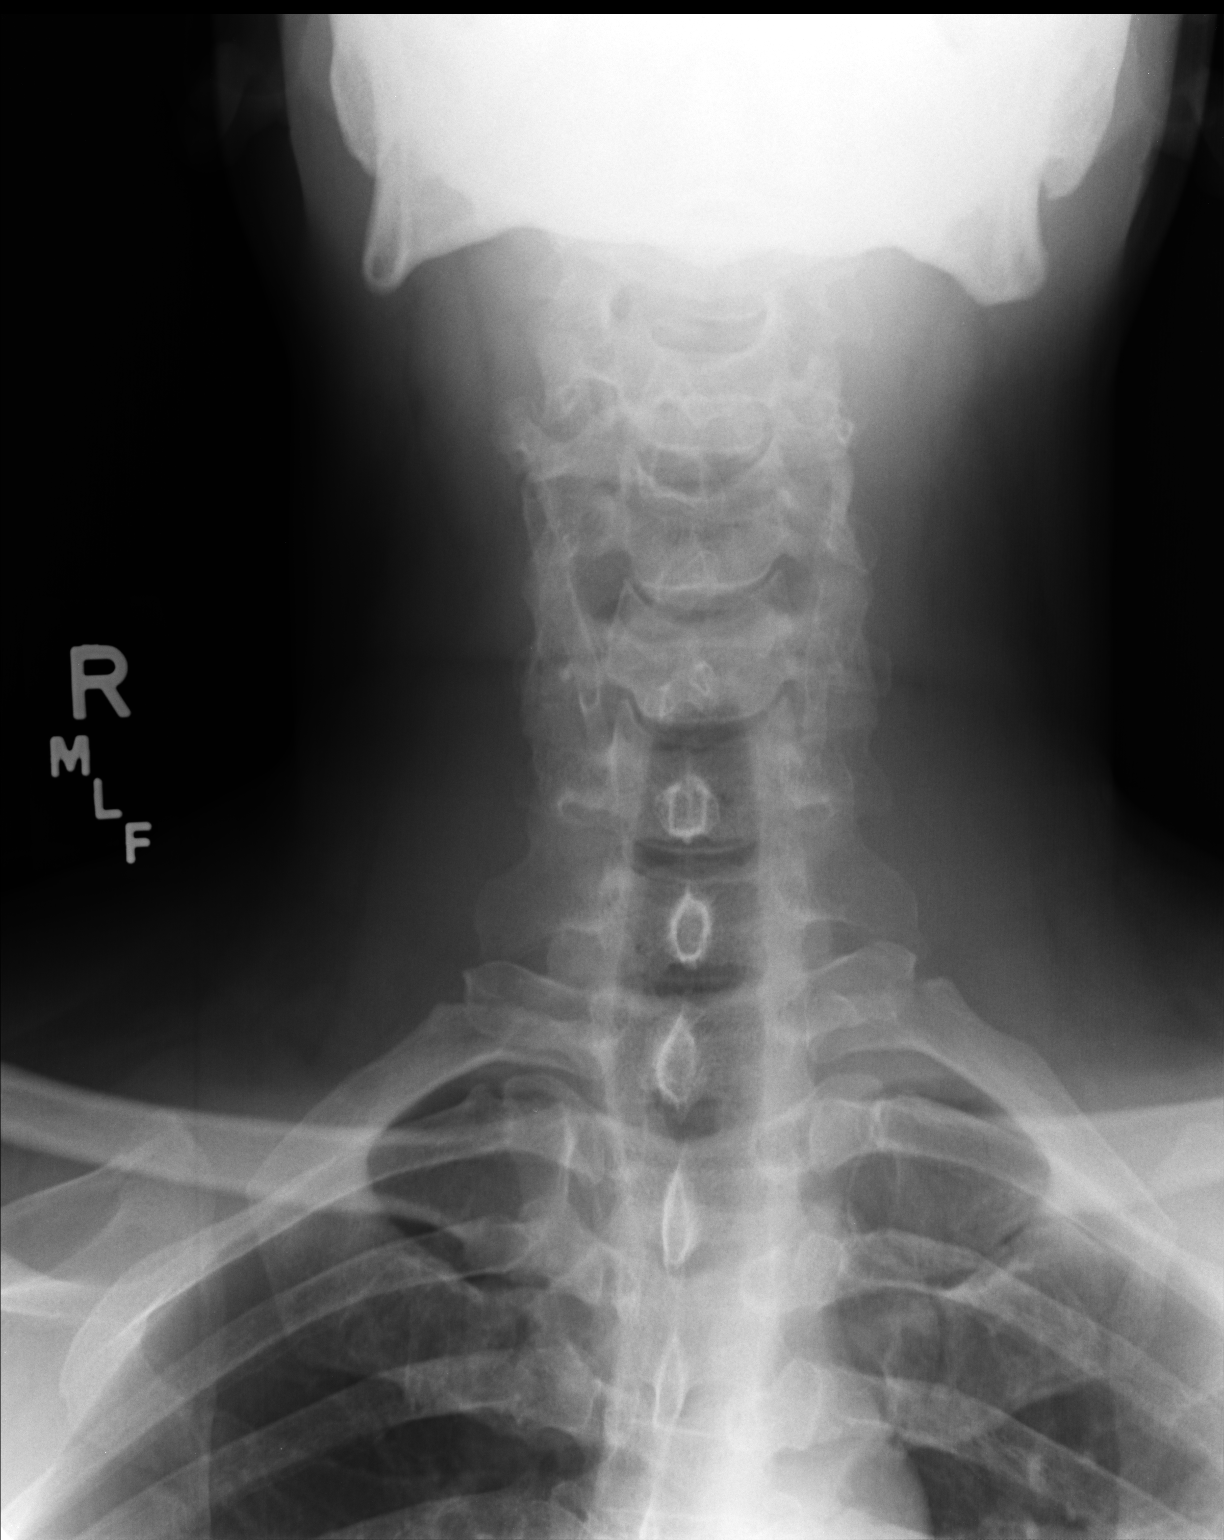

[AP (2 of 2)]
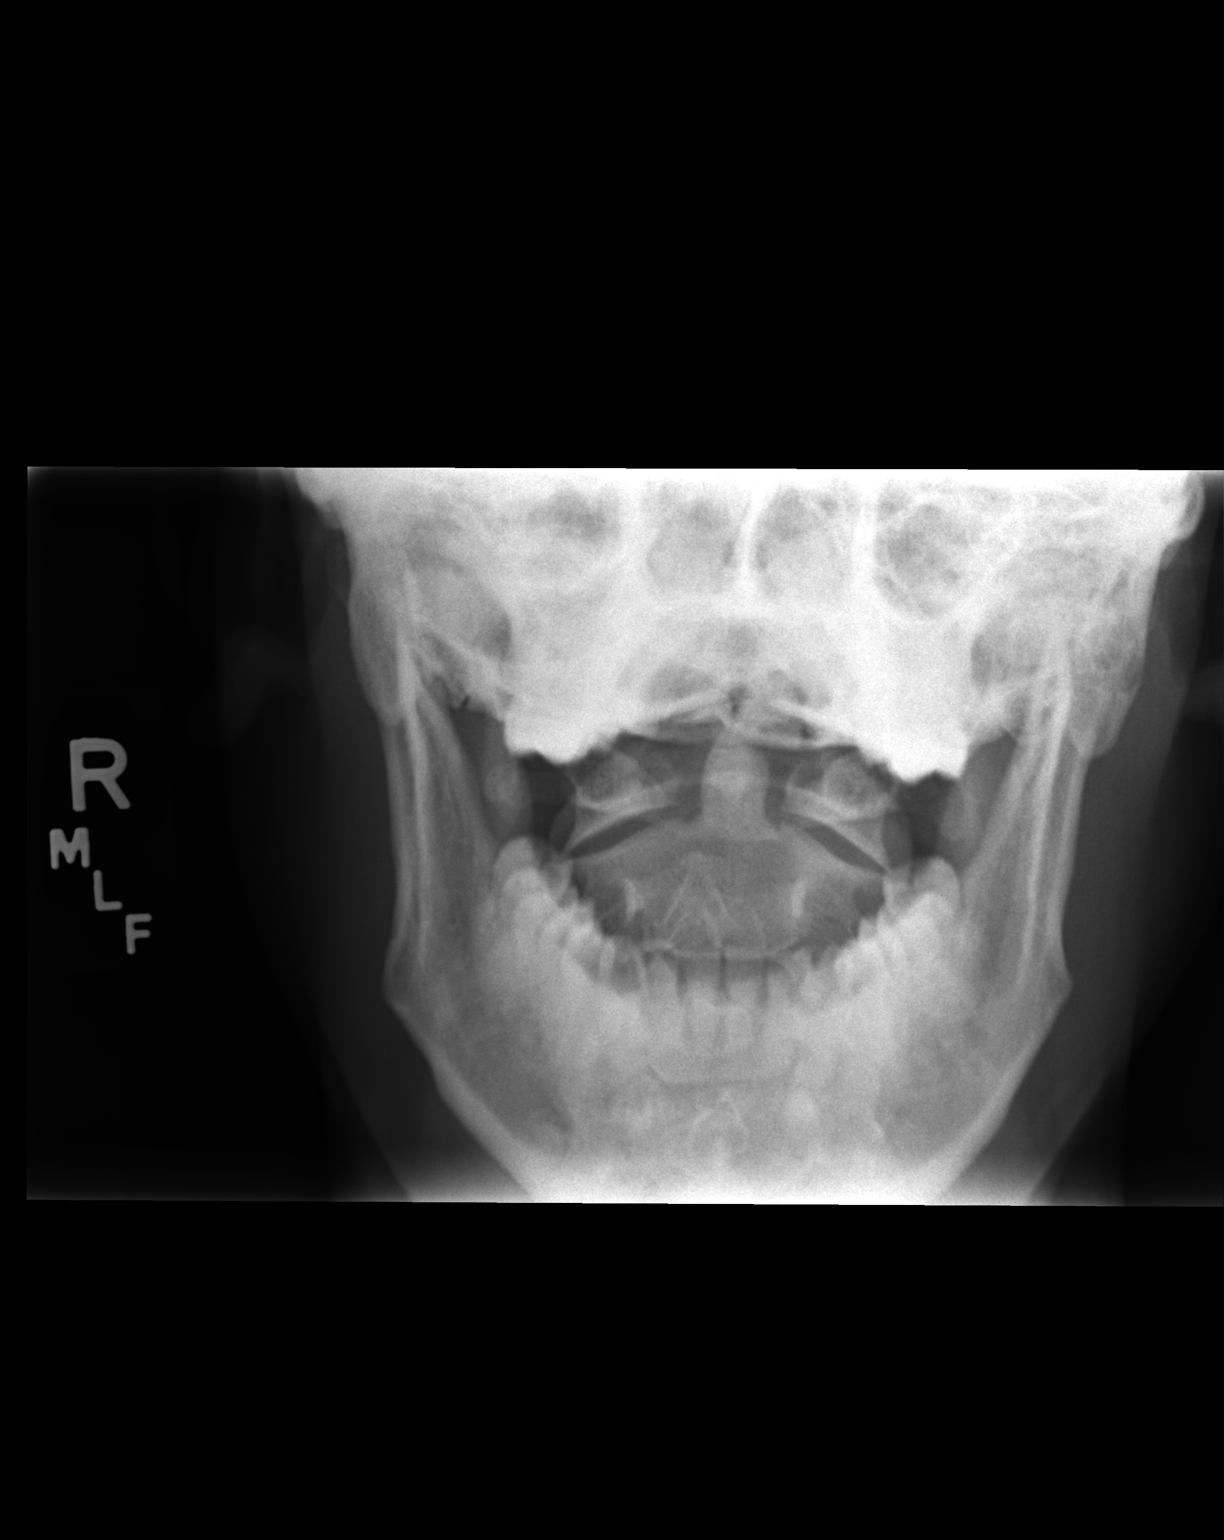

[3 of 3 positions shown; findings below may reference images not displayed]

FINDINGS: Alignment of the cervical spine is within normal limits. No
significant degenerative changes are identified. No soft tissue
swelling. No bony lesions are seen.
IMPRESSION: Normal cervical spine radiographs.

## 2016-01-14 ENCOUNTER — Other Ambulatory Visit: Payer: Self-pay | Admitting: Family Medicine

## 2016-01-14 ENCOUNTER — Ambulatory Visit (INDEPENDENT_AMBULATORY_CARE_PROVIDER_SITE_OTHER): Payer: BC Managed Care – PPO | Admitting: Family Medicine

## 2016-01-14 VITALS — BP 132/86 | HR 74 | Temp 97.9°F | Resp 16 | Ht 72.0 in | Wt 203.0 lb

## 2016-01-14 DIAGNOSIS — M25511 Pain in right shoulder: Secondary | ICD-10-CM

## 2016-01-14 DIAGNOSIS — R0789 Other chest pain: Secondary | ICD-10-CM | POA: Diagnosis not present

## 2016-01-14 DIAGNOSIS — M545 Low back pain: Secondary | ICD-10-CM

## 2016-01-14 DIAGNOSIS — Z0000 Encounter for general adult medical examination without abnormal findings: Secondary | ICD-10-CM

## 2016-01-14 DIAGNOSIS — K219 Gastro-esophageal reflux disease without esophagitis: Secondary | ICD-10-CM | POA: Diagnosis not present

## 2016-01-14 LAB — POCT CBC
GRANULOCYTE PERCENT: 42.4 % (ref 37–80)
HEMATOCRIT: 40.4 % — AB (ref 43.5–53.7)
Hemoglobin: 13.1 g/dL — AB (ref 14.1–18.1)
Lymph, poc: 3.5 — AB (ref 0.6–3.4)
MCH, POC: 22.9 pg — AB (ref 27–31.2)
MCHC: 32.5 g/dL (ref 31.8–35.4)
MCV: 70.2 fL — AB (ref 80–97)
MID (CBC): 0.8 (ref 0–0.9)
MPV: 8.2 fL (ref 0–99.8)
POC GRANULOCYTE: 3.1 (ref 2–6.9)
POC LYMPH %: 47 % (ref 10–50)
POC MID %: 10.6 %M (ref 0–12)
Platelet Count, POC: 169 10*3/uL (ref 142–424)
RBC: 5.75 M/uL (ref 4.69–6.13)
RDW, POC: 16.9 %
WBC: 7.4 10*3/uL (ref 4.6–10.2)

## 2016-01-14 LAB — POCT GLYCOSYLATED HEMOGLOBIN (HGB A1C): Hemoglobin A1C: 5.8

## 2016-01-14 MED ORDER — MELOXICAM 15 MG PO TABS: 15.0000 mg | ORAL_TABLET | Freq: Every day | ORAL | Status: DC

## 2016-01-14 NOTE — Progress Notes (Signed)
Patient ID: Aliene BeamsBobby Justice Jr., male    DOB: 15-Jun-1966  Age: 50 y.o. MRN: [id number]  Chest pain, back pain, and shoulder pain  Subjective:   Patient had an episode of pain shooting through his chest last night, it was a sharp pain for less than a minute in the right upper sternal area. He has a history of GERD but this was different. He's not had pain quite like this. Went away on its own. He took about 4 aspirin and came in today for his physical which he plans to get.  He has been having problems with pain in his low back. Major injuries or problems there. Also has pain in his shoulder.  Current allergies, medications, problem list, past/family and social histories reviewed.  Objective:  BP 132/86 mmHg  Pulse 74  Temp(Src) 97.9 F (36.6 C)  Resp 16  Ht 6' (1.829 m)  Wt 203 lb (92.08 kg)  BMI 27.53 kg/m2  SpO2 97%  No acute distress. Chest clear. Heart regular without murmurs. No chest wall tenderness. Abdomen soft without mass or tenderness.  Fair range of motion of back. Shoulder has good range of motion, nontender.  Assessment & Plan:   Assessment: 1. Atypical chest pain   2. Gastroesophageal reflux disease without esophagitis    Shoulder pain Back pain   Plan: EKG  EKG is normal. At least unchanged from the last.   If he has further episodes we will probably send him back to the cardiologist for a repeat assessment.  No orders of the defined types were placed in this encounter.    No orders of the defined types were placed in this encounter.         Patient Instructions       IF you received an x-ray today, you will receive an invoice from [facility]. Please contact [facility] at [phone] with questions or concerns regarding your invoice.   IF you received labwork today, you will receive an invoice from [facility]. Please contact [facility] at [phone] with questions or concerns regarding your  invoice.   Our billing staff will not be able to assist you with questions regarding bills from these companies.  You will be contacted with the lab results as soon as they are available. The fastest way to get your results is to activate your My Chart account. Instructions are located on the last page of this paperwork. If you have not heard from us regarding the results in 2 weeks, please contact this office.         No Follow-up on file.   Kiersten Coss, MD 01/14/2016

## 2016-01-14 NOTE — Progress Notes (Signed)
History: This is a 50 year old man previously known to me.  He has not been having any major acute problems. Needs his immunizations reviewed.   Past medical history: Operations: None Major illnesses: None Regular medications: See list Allergies: See list  Social history: Married, 5 children including 2 sets of twins. 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  Family history: Mother lived in [place], father lives in the [place]. Father is getting ready to move back to [place]. No major familial diseases. However his mother with cervical cancer and his cousin with breast cancer died in last year.  Review of systems: Constitutional: Unremarkable HEENT: Unremarkable Cardiovascular: Chest pain last night as noted in the other note  Respiratory: Unremarkable Gastrointestinal: Some GERD GU: Unremarkable. Genitalia seems to him to have gotten smaller she gets older. Musculoskeletal: Unremarkable Neurologic: Unremarkable Dermatologic: Unremarkable Psychiatric: Unremarkable Neurologic: Unremarkable Endocrine: Unremarkable  Physical exam: Well-developed man in no acute distress. TMs normal. Eyes PERRLA. Benign. Throat clear. Neck supple without nodes thyromegaly. No carotid bruits. Chest clear process. Heart regular without murmurs. And soft without mass or tenderness. Normal male external genitalia with testes descended. No hernias. Digital exam declined Extremities unremarkable. Skin unremarkable.  Assessment: Physical examination Chest pains, atypical GERD    Plan:  Continue taking the Carafate for his GERD  EKG unchanged from previous  Results for orders placed or performed in visit on 01/14/16  POCT CBC  Result Value Ref Range   WBC 7.4 4.6 - 10.2 K/uL   Lymph, poc 3.5 (A) 0.6 - 3.4   POC LYMPH PERCENT 47.0 10 - 50 %L   MID (cbc) 0.8 0 - 0.9   POC MID % 10.6 0 - 12 %M   POC Granulocyte 3.1 2 - 6.9   Granulocyte percent 42.4 37 - 80 %G   RBC 5.75 4.69 -  6.13 M/uL   Hemoglobin 13.1 (A) 14.1 - 18.1 g/dL   HCT, POC 16.140.4 (A) 09.643.5 - 53.7 %   MCV 70.2 (A) 80 - 97 fL   MCH, POC 22.9 (A) 27 - 31.2 pg   MCHC 32.5 31.8 - 35.4 g/dL   RDW, POC 04.516.9 %   Platelet Count, POC 169 142 - 424 K/uL   MPV 8.2 0 - 99.8 fL  POCT glycosylated hemoglobin (Hb A1C)  Result Value Ref Range   Hemoglobin A1C 5.8     He has microcytosis, 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 Of no concern at this time. Additional labs are pending.

## 2016-01-14 NOTE — Patient Instructions (Addendum)
This is an atypical chest pain. If you have further episodes, we will send you to the cardiologist.  Take meloxicam 15 mg 1 daily after breakfast for the back and shoulder pain. Try this for about 2 weeks and see if it will calm things down. After that use only on an as-needed basis. If not we may need to do further evaluations of you.  I will let you know the remainder of your lab results in a few days.  Recommend that you start getting some more regular exercise. I realize you're time is a priority. However exercise will help keep your back was stronger and your body more limber as well as being good for your heart.  If doing well return in about 1 year. Next few you will need your first screening colonoscopy done.    IF you received an x-ray today, you will receive an invoice from [facility]. Please contact [facility] at [phone] with questions or concerns regarding your invoice.   IF you received labwork today, you will receive an invoice from [facility]. Please contact [facility] at [phone] with questions or concerns regarding your invoice.   Our billing staff will not be able to assist you with questions regarding bills from these companies.  You will be contacted with the lab results as soon as they are available. The fastest way to get your results is to activate your My Chart account. Instructions are located on the last page of this paperwork. If you have not heard from us regarding the results in 2 weeks, please contact this office.

## 2016-01-14 NOTE — Addendum Note (Signed)
Addended by: Quinetta Shilling H on: 01/14/2016 06:08 PM   Modules accepted: Orders

## 2016-01-15 LAB — COMPLETE METABOLIC PANEL WITH GFR
ALBUMIN: 4.5 g/dL (ref 3.6–5.1)
ALK PHOS: 52 U/L (ref 40–115)
ALT: 15 U/L (ref 9–46)
AST: 14 U/L (ref 10–40)
BILIRUBIN TOTAL: 0.6 mg/dL (ref 0.2–1.2)
BUN: 20 mg/dL (ref 7–25)
CO2: 24 mmol/L (ref 20–31)
CREATININE: 1.14 mg/dL (ref 0.60–1.35)
Calcium: 9.1 mg/dL (ref 8.6–10.3)
Chloride: 102 mmol/L (ref 98–110)
GFR, EST AFRICAN AMERICAN: 87 mL/min (ref >=60)
GFR, EST NON AFRICAN AMERICAN: 75 mL/min (ref >=60)
GLUCOSE: 81 mg/dL (ref 65–99)
Potassium: 3.8 mmol/L (ref 3.5–5.3)
SODIUM: 137 mmol/L (ref 135–146)
TOTAL PROTEIN: 7.7 g/dL (ref 6.1–8.1)

## 2016-01-15 LAB — LIPID PANEL
Cholesterol: 224 mg/dL — ABNORMAL HIGH (ref 125–200)
HDL: 74 mg/dL (ref >=40)
LDL CALC: 137 mg/dL — AB (ref <130)
Total CHOL/HDL Ratio: 3 Ratio (ref <=5.0)
Triglycerides: 67 mg/dL (ref <150)
VLDL: 13 mg/dL (ref <30)

## 2016-01-16 ENCOUNTER — Telehealth: Payer: Self-pay

## 2016-01-16 LAB — PSA: PSA: 0.29 ng/mL (ref <=4.00)

## 2016-01-16 NOTE — Telephone Encounter (Signed)
Pt states that Dr. Alwyn RenHopper was suppose to prescribe him some Ibuprofen but the pharmacy never received the order. Please contact patient with status.

## 2016-01-18 ENCOUNTER — Encounter: Payer: Self-pay | Admitting: Family Medicine

## 2016-01-18 NOTE — Telephone Encounter (Signed)
Call patient: He was given meloxicam, which is like a long-acting ibuprofen that he only has to take once a day. He is not to take ibuprofen in addition to this.

## 2016-01-20 NOTE — Telephone Encounter (Signed)
Left message for pt to call back  °

## 2016-01-20 NOTE — Telephone Encounter (Signed)
Pt advised. Pt would like a Rx for Cialis as well.

## 2016-01-21 ENCOUNTER — Other Ambulatory Visit: Payer: Self-pay | Admitting: Family Medicine

## 2016-01-21 DIAGNOSIS — N529 Male erectile dysfunction, unspecified: Secondary | ICD-10-CM

## 2016-01-21 MED ORDER — TADALAFIL 20 MG PO TABS: 10.0000 mg | ORAL_TABLET | ORAL | Status: DC | PRN

## 2016-01-21 NOTE — Telephone Encounter (Signed)
Call: Prescribed cialis

## 2016-01-21 NOTE — Telephone Encounter (Signed)
Pt advised.

## 2016-01-23 ENCOUNTER — Telehealth: Payer: Self-pay

## 2016-01-23 NOTE — Telephone Encounter (Signed)
Msg is for Dr. Alwyn RenHopper, give a call about insurance to see if tadalafil (CIALIS) 20 MG tablet is covered.  Please advise  [phone]

## 2016-01-27 ENCOUNTER — Telehealth: Payer: Self-pay

## 2016-01-27 NOTE — Telephone Encounter (Signed)
Completed PA. Pending. 

## 2016-01-27 NOTE — Telephone Encounter (Signed)
Started PA on covermymeds. Waiting for clin ?s to load. 

## 2016-01-28 NOTE — Telephone Encounter (Signed)
PA was denied because it is a plan exclusion. No other info was given as to whether another ED med is covered or if no ED meds are covered under the plan. Dr Alwyn RenHopper, do you want to try Viagra to see if I can get it approved?

## 2016-01-28 NOTE — Telephone Encounter (Signed)
Try Viagra 100 mg #10 take 1 pill 30 minutes prior to anticipated intercourse refill 5

## 2016-01-29 MED ORDER — SILDENAFIL CITRATE 100 MG PO TABS: ORAL_TABLET | ORAL | Status: DC

## 2016-01-29 NOTE — Telephone Encounter (Signed)
Sent in Viagra Rx and tried to let pt know about new Rx, but his VM was full.

## 2016-01-29 NOTE — Telephone Encounter (Signed)
Called pt back and advised to have pharm, or himself, let me know if the Viagra is not covered either and Dr Alwyn RenHopper can perhaps write a Rx for the generic Viagra. Pt agreed. Also discussed that a Rx had been sent in for Mobic at [facility] for him to take instead of ibuprofen. Pt stated that it hadn't been ready when he went by after [facility], but will check w/them again. instr'd pt to NOT take any other NSAIDS with Mobic. Pt agreed.

## 2016-01-29 NOTE — Telephone Encounter (Signed)
Pt called re. Missed call....  Relayed msg....    Vicenta DunningBarbara A Briggs, RN at 01/29/2016 10:57 AM     Status: Signed       Expand All Collapse All   Sent in Viagra Rx and tried to let pt know about new Rx, but his VM was full.      AND       Pt would also like rx for ibuprofen (ADVIL,MOTRIN) 600 MG tablet [[id number]][[id number]]  Pharmacy:  [facility] [id number] [place] [address] [place] - [address] AT [facility] [address]   [phone]

## 2016-01-31 NOTE — Telephone Encounter (Signed)
See notes under 5/1 message. Do not need to let pt know anything else at this point, already spoke to him.

## 2016-02-02 ENCOUNTER — Other Ambulatory Visit: Payer: Self-pay | Admitting: Family Medicine

## 2016-02-04 NOTE — Telephone Encounter (Signed)
Spoke with patient does not remember taking or picking up mobic, he is going to check his medications and call back.

## 2016-02-22 ENCOUNTER — Telehealth: Payer: Self-pay

## 2016-02-22 MED ORDER — CELECOXIB 200 MG PO CAPS: 200.0000 mg | ORAL_CAPSULE | Freq: Two times a day (BID) | ORAL | Status: DC

## 2016-02-22 NOTE — Telephone Encounter (Signed)
Pt says meloxicam is not working. Pt wants something else for pain. He would like to have Ibuprofen 600 or 800mg  sent in to his pharmacy.

## 2016-02-22 NOTE — Telephone Encounter (Signed)
I do not recommend ibuprofen, as he has GERD. I recommend celebrex, which is less irritating to the GI tract.  Meds ordered this encounter  Medications  . celecoxib (CELEBREX) 200 MG capsule    Sig: Take 1 capsule (200 mg total) by mouth 2 (two) times daily.    Dispense:  30 capsule    Refill:  0    Order Specific Question:  Supervising Provider    Answer:  DOOLITTLE, ROBERT P [[id number]]

## 2016-02-22 NOTE — Addendum Note (Signed)
Addended by: Fernande BrasJEFFERY, Yanel Dombrosky S on: 02/22/2016 01:39 PM   Modules accepted: Orders, Medications

## 2016-02-25 NOTE — Telephone Encounter (Signed)
Pt.notified

## 2016-04-12 ENCOUNTER — Other Ambulatory Visit: Payer: Self-pay | Admitting: Family Medicine

## 2016-04-26 ENCOUNTER — Ambulatory Visit (HOSPITAL_COMMUNITY)
Admission: EM | Admit: 2016-04-26 | Discharge: 2016-04-26 | Disposition: A | Discharge status: Home / Self Care | Payer: BC Managed Care – PPO | Attending: Emergency Medicine | Admitting: Emergency Medicine

## 2016-04-26 ENCOUNTER — Encounter (HOSPITAL_COMMUNITY): Payer: Self-pay | Admitting: Emergency Medicine

## 2016-04-26 ENCOUNTER — Other Ambulatory Visit: Payer: Self-pay | Admitting: Physician Assistant

## 2016-04-26 DIAGNOSIS — M25552 Pain in left hip: Secondary | ICD-10-CM | POA: Diagnosis not present

## 2016-04-26 MED ORDER — IBUPROFEN 800 MG PO TABS
ORAL_TABLET | ORAL | Status: AC
Start: 2016-04-26 — End: 2016-04-26
  Filled 2016-04-26: qty 1

## 2016-04-26 MED ORDER — IBUPROFEN 800 MG PO TABS
800.0000 mg | ORAL_TABLET | Freq: Once | ORAL | Status: AC
  Administered 2016-04-26: 800 mg via ORAL

## 2016-04-26 MED ORDER — IBUPROFEN 800 MG PO TABS: 800.0000 mg | ORAL_TABLET | Freq: Three times a day (TID) | ORAL | 0 refills | Status: AC

## 2016-04-26 NOTE — ED Triage Notes (Signed)
The patient presented to the [facility] with a complaint of left hip pain that started 2 days ago. The patient denied any known injury to his left hip.

## 2016-04-26 NOTE — ED Provider Notes (Signed)
CSN: [id number]     Arrival date & time 04/26/16  1203 History   First MD Initiated Contact with Patient 04/26/16 1303     Chief Complaint  Patient presents with  . Hip Pain   (Consider location/radiation/quality/duration/timing/severity/associated sxs/prior Treatment)  HPI   The patient is a 50 year old male presenting today with complaints of left hip pain and need for immunizations prior to his trip to [place] in which he is leaving tomorrow.  Patient states he has a history of a fracture in his right hip however his left hip has been hurting him for approximately 2 days and it is worse with sitting and "getting up".  In addition he states that Dr. Clearance Coots usually writes him a prescription for anxiety when he flies and requests a prescription for anxiety. She and advised that we do not prescribe medications for anxiety at an urgent care center.  Past Medical History:  Diagnosis Date  . GERD (gastroesophageal reflux disease)   . Hyperlipidemia   . Hypertension    History reviewed. No pertinent surgical history. History reviewed. No pertinent family history. Social History  Substance Use Topics  . Smoking status: Never Smoker  . Smokeless tobacco: Not on file  . Alcohol use No    Review of Systems  Allergies  Chloroquine  Home Medications   Prior to Admission medications   Medication Sig Start Date End Date Taking? Authorizing Provider  sucralfate (CARAFATE) 1 g tablet TAKE 1 TABLET BY MOUTH FOUR TIMES DAILY WITH MEALS AND AT BEDTIME 04/16/16  Yes Peyton Najjar, MD  ALPRAZolam Prudy Feeler) 0.5 MG tablet Take one pill twice daily when needed for anxiety of traveling Patient not taking: Reported on 05/30/2015 11/10/14   Peyton Najjar, MD  aspirin EC 81 MG tablet Take 1 tablet (81 mg total) by mouth daily. 04/03/14   Peyton Najjar, MD  atorvastatin (LIPITOR) 40 MG tablet Take 1 tablet (40 mg total) by mouth daily. Patient not taking: Reported on 11/10/2014 01/24/14   Carmelina Dane,  MD  atovaquone-proguanil (MALARONE) 250-100 MG TABS Take 1 daily beginning 1 day before trip until 1 week after trip for malaria prevention Patient not taking: Reported on 01/14/2016 11/10/14   Peyton Najjar, MD  celecoxib (CELEBREX) 200 MG capsule Take 1 capsule (200 mg total) by mouth 2 (two) times daily. 02/22/16   Chelle Jeffery, PA-C  ciprofloxacin (CIPRO) 500 MG tablet Take one pill twice daily starting at onset of traveler's diarrhea. Continue for 3 or 4 days. Patient not taking: Reported on 05/30/2015 11/10/14   Peyton Najjar, MD  dexlansoprazole (DEXILANT) 60 MG capsule Take 1 capsule (60 mg total) by mouth daily. Patient not taking: Reported on 05/30/2015 04/03/14   Peyton Najjar, MD  ibuprofen (ADVIL,MOTRIN) 800 MG tablet Take 1 tablet (800 mg total) by mouth 3 (three) times daily. 04/26/16   Servando Salina, NP  sildenafil (VIAGRA) 100 MG tablet Take 1 tablet 30 minutes prior to anticipated intercourse. 01/29/16   Peyton Najjar, MD  tadalafil (CIALIS) 20 MG tablet Take 0.5-1 tablets (10-20 mg total) by mouth every other day as needed for erectile dysfunction. 01/21/16   Peyton Najjar, MD   Meds Ordered and Administered this Visit   Medications  ibuprofen (ADVIL,MOTRIN) tablet 800 mg (800 mg Oral Given 04/26/16 1320)    BP 148/93 (BP Location: Right Arm)   Pulse 62   Temp 98.2 F (36.8 C) (Oral)   Resp 18   SpO2  98%  No data found.   Physical Exam  Constitutional: He appears well-developed and well-nourished. No distress.  Cardiovascular: Normal rate, regular rhythm, normal heart sounds and intact distal pulses.  Exam reveals no gallop and no friction rub.   Pulmonary/Chest: Effort normal and breath sounds normal. No respiratory distress. He has no wheezes. He has no rales. He exhibits no tenderness.  Musculoskeletal:       Left hip: He exhibits tenderness. He exhibits normal range of motion, normal strength, no bony tenderness, no swelling, no crepitus, no deformity and no  laceration.  The patient is able to ambulate to treatment area without difficulty or assistance.  No evidence of limp or antalgic gait. No surface trauma, bruising.  No erythema or warmth. No deformity, crepitus, or obvious asymmetry of the affected leg compared to the other. Distal motor and neurovascular status intact.      Skin: Skin is warm and dry. He is not diaphoretic.  Nursing note and vitals reviewed.   Urgent Care Course   Clinical Course    Procedures (including critical care time)  Labs Review Labs Reviewed - No data to display  Imaging Review No results found.  The patient advised that we do not have the requisite immunizations for travel abroad and given referral to either the health Department, [facility] or [facility] on [address]. Advised that we are unable to provide medication for anxiety and an urgent care setting and he was to contact his primary care provider Dr. Clearance Coots for prescription. Lastly, patient requests prescription for ibuprofen stating he is unable to travel with over-the-counter medications.   MDM   1. Left hip pain    I suspect he has a muscle strain of the left hip. States has history of right hip fracture and is perhaps unconsciously favoring his right hip and straining his left. Advised that he needed to follow-up with an orthopedist for further evaluation. Given ibuprofen and told to rest his hip as much as possible for comfort.  Meds ordered this encounter  Medications  . ibuprofen (ADVIL,MOTRIN) tablet 800 mg  . ibuprofen (ADVIL,MOTRIN) 800 MG tablet    Sig: Take 1 tablet (800 mg total) by mouth 3 (three) times daily.    Dispense:  21 tablet    Refill:  0       Servando Salina, NP 04/26/16 1345

## 2016-04-26 NOTE — Discharge Instructions (Signed)
Follow up with Dr. Clearance Coots for prescription for anxiety.  You can contact the [facility] listed below for travel immunizations.   [facility] [phone] [address] [place], [place] [address] [phone] Make this your store   Directions Cross streets: [address]  Rest and take it easy with your hip.  Follow up with orthopedist if symptoms worsen or fail to improve.

## 2016-04-27 NOTE — Telephone Encounter (Signed)
Chelle, do you want to give RFs? 

## 2016-04-27 NOTE — Telephone Encounter (Signed)
Patient was seen at the [facility] yesterday for hip pain and was prescribed ibuprofen 800 mg. Please remind him that he CANNOT take the celecoxib AND the ibuprofen.  Meds ordered this encounter  Medications  . celecoxib (CELEBREX) 200 MG capsule    Sig: TAKE 1 CAPSULE(200 MG) BY MOUTH TWICE DAILY    Dispense:  180 capsule    Refill:  0

## 2016-06-25 ENCOUNTER — Telehealth: Payer: Self-pay

## 2016-06-25 NOTE — Telephone Encounter (Signed)
Patient LMOVM requesting Last CPE & OV notes  He will complete ROI when he comes in.  Printed and placed in Pick up Drawer

## 2016-06-26 NOTE — Telephone Encounter (Signed)
Please put this in my box to sign or bring it to me to sign so we can get this done for him. Deliah BostonMichael Zykiria Bruening, MS, PA-C 1:31 PM, 06/26/2016

## 2016-06-26 NOTE — Telephone Encounter (Signed)
Form to be signed in box in Dr. Prince RomeLounge, saw Dr. Alwyn RenHopper in April.

## 2016-06-30 NOTE — Telephone Encounter (Signed)
Donald Wyatt came back to pick up his forms and was informed that we need further information in order for a provider to sign the paperwork. Patient would like to come back on Thursday when Dr. Alwyn RenHopper is here so that he can complete and sign the paperwork. Please call patient when ready for pick up! Okay per [place].  [phone]

## 2016-07-02 ENCOUNTER — Ambulatory Visit (INDEPENDENT_AMBULATORY_CARE_PROVIDER_SITE_OTHER): Payer: BC Managed Care – PPO | Admitting: Family Medicine

## 2016-07-02 VITALS — BP 122/78 | HR 68 | Temp 98.0°F | Resp 16 | Ht 73.0 in | Wt 200.2 lb

## 2016-07-02 DIAGNOSIS — Z2839 Other underimmunization status: Secondary | ICD-10-CM

## 2016-07-02 DIAGNOSIS — Z23 Encounter for immunization: Secondary | ICD-10-CM | POA: Diagnosis not present

## 2016-07-02 DIAGNOSIS — Z021 Encounter for pre-employment examination: Secondary | ICD-10-CM | POA: Diagnosis not present

## 2016-07-02 DIAGNOSIS — Z283 Underimmunization status: Secondary | ICD-10-CM | POA: Diagnosis not present

## 2016-07-02 DIAGNOSIS — K219 Gastro-esophageal reflux disease without esophagitis: Secondary | ICD-10-CM | POA: Diagnosis not present

## 2016-07-02 LAB — POCT URINALYSIS DIP (MANUAL ENTRY)
BILIRUBIN UA: NEGATIVE
Bilirubin, UA: NEGATIVE
Glucose, UA: NEGATIVE
Leukocytes, UA: NEGATIVE
Nitrite, UA: NEGATIVE
PROTEIN UA: NEGATIVE
SPEC GRAV UA: 1.02
Urobilinogen, UA: 0.2
pH, UA: 6

## 2016-07-02 NOTE — Progress Notes (Signed)
. ,   male    DOB: June 26, 1966  Age: 50 y.o. MRN: [id number]  Chief Complaint  Patient presents with  . 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 Actually did a complete physical exam on him a few months ago. He has been doing well. He is quite healthy. Go back to review the notes from April.  Reflux is continuing to be stable on his medication. No other major acute problems.  Current allergies, medications, problem list, past/family and social histories reviewed.  Objective:  BP 122/78 (BP Location: Right Arm, Patient Position: Sitting, Cuff Size: Normal)   Pulse 68   Temp 98 F (36.7 C) (Oral)   Resp 16   Ht 6\' 1"  (1.854 m)   Wt 200 lb 3.2 oz (90.8 kg)   SpO2 97%   BMI 26.41 kg/m   Healthy man, no acute distress. Alert and oriented. HEENT normal. Chest clear. Heart regular without murmurs. Normal pedal pulses. Abdominal and genital exam not repeated since we just recently did that.  Assessment & Plan:   Assessment: 1. Immunization deficiency   2. Gastroesophageal reflux disease without esophagitis   3. Needs flu shot   4. 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  Incidentally as he was leaving he complained of generalized body aches. We talked about that and I recommended regular use of Tylenol on a when necessary basis. Wrote down the maximum doses for him.  Orders Placed This Encounter  Procedures  . Flu Vaccine QUAD 36+ mos IM  . Tdap vaccine greater than or equal to 7yo IM  . Hepatitis B surface antibody  . POCT urinalysis dipstick  . TB Skin Test    Order Specific Question:   Has patient ever tested positive?    Answer:   No    No orders of the defined types were placed in this encounter.        Patient Instructions  You will receive your flu shot today  You will receive your tetanus vaccination today  PPD skin test for  tuberculosis requires returning in 48-72 hours, and since this practice is closed on Sunday that means you need to come in on Saturday afternoon to get the skin tests looked at. They will need to initial on your forms at that time that it was negative.  We are checking a hepatitis antibody to be certain that you are hepatitis immune, that you have had the shot in the past, and if it came back negative you would have to come back for a 3 shot series.  Forms have been completed and approved.  Continue your current treatment for your reflux.  Return if needed    No Follow-up on file.   Raymonda Pell, MD 07/02/2016

## 2016-07-02 NOTE — Patient Instructions (Addendum)
You will receive your flu shot today  You will receive your tetanus vaccination today  PPD skin test for tuberculosis requires returning in 48-72 hours, and since this practice is closed on Sunday that means you need to come in on Saturday afternoon to get the skin tests looked at. They will need to initial on your forms at that time that it was negative.  We are checking a hepatitis antibody to be certain that you are hepatitis immune, that you have had the shot in the past, and if it came back negative you would have to come back for a 3 shot series.  Forms have been completed and approved.  Continue your current treatment for your reflux.  Return if needed

## 2016-07-03 LAB — HEPATITIS B SURFACE ANTIBODY, QUANTITATIVE: Hepatitis B-Post: <5 m[IU]/mL

## 2016-07-06 ENCOUNTER — Ambulatory Visit (INDEPENDENT_AMBULATORY_CARE_PROVIDER_SITE_OTHER): Payer: Self-pay | Admitting: Physician Assistant

## 2016-07-06 DIAGNOSIS — T50A95A Adverse effect of other bacterial vaccines, initial encounter: Secondary | ICD-10-CM

## 2016-07-06 DIAGNOSIS — R7611 Nonspecific reaction to tuberculin skin test without active tuberculosis: Secondary | ICD-10-CM

## 2016-07-06 DIAGNOSIS — Z111 Encounter for screening for respiratory tuberculosis: Secondary | ICD-10-CM

## 2016-07-06 LAB — TB SKIN TEST: TB SKIN TEST: NEGATIVE

## 2016-07-06 NOTE — Progress Notes (Signed)
Patient was here for ppd read. PPD test was 17mm but was discussed with provider.

## 2016-07-06 NOTE — Progress Notes (Signed)
   Subjective:    Patient ID: Donald BeamsBobby Shippey Jr., male    DOB: [birth year], 50 y.o.   MRN: [id number]  HPI  Donald BeamsBobby Radford Jr. [facility]. Induration on his forearm is noted to be 17mm, measured medially to laterally. Mr. Sarita BottomSarteh Jr notes he is positive every time he has an encounter for PPD results.  He states he has been to the health department for tuberculosis work-up and was found to be negative.  He was born [place] and states he received the Bacillus Calmette-Guerin (BCG) vaccination as a small child, which causes a false positive.  He works at the [employer] has had the conversation with his administration about false positive PPD tests. Notes they keep asking him to get tested.   He is feeling well and is not symptomatic today.   Review of Systems  Constitutional: Negative for chills, diaphoresis, fatigue, fever and unexpected weight change.  Respiratory: Negative for cough, chest tightness and shortness of breath.   Cardiovascular: Negative for chest pain and palpitations.  Musculoskeletal: Negative for arthralgias and myalgias.  Neurological: Negative for dizziness, weakness and headaches.       Objective:   Physical Exam  Constitutional: He is oriented to person, place, and time. He appears well-developed and well-nourished. No distress.  Cardiovascular: Normal rate and regular rhythm.   Pulmonary/Chest: Effort normal and breath sounds normal. No respiratory distress.  Neurological: He is alert and oriented to person, place, and time.  Skin: Rash (17mm induration right forearm) noted. He is not diaphoretic.  Psychiatric: He has a normal mood and affect. His behavior is normal. Judgment and thought content normal.  Vitals reviewed.     Chest x-rays from the past 5 years reviewed by me. No signs suggestive of active TB disease.  Assessment & Plan:  1. 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 Encounter for PPD skin  test reading - I believe this is a false positive due to Mr. Donald Wyatt's past medical history of receiving the BCG vaccination as a child. He has been evaluated by the health department and has multiple negative chest x-rays in the past. I recommend Mr Sarita BottomSarteh Jr. no longer receive the PPD test as an evaluation of exposure to TB, as it will continue to produce positive results.

## 2016-09-09 ENCOUNTER — Ambulatory Visit: Payer: Self-pay

## 2016-09-11 ENCOUNTER — Encounter (INDEPENDENT_AMBULATORY_CARE_PROVIDER_SITE_OTHER): Payer: Self-pay | Admitting: Orthopaedic Surgery

## 2016-09-11 ENCOUNTER — Ambulatory Visit (INDEPENDENT_AMBULATORY_CARE_PROVIDER_SITE_OTHER): Payer: BC Managed Care – PPO | Admitting: Orthopaedic Surgery

## 2016-09-11 VITALS — BP 137/83 | HR 63 | Ht 72.0 in | Wt 200.0 lb

## 2016-09-11 DIAGNOSIS — G8929 Other chronic pain: Secondary | ICD-10-CM

## 2016-09-11 DIAGNOSIS — M5441 Lumbago with sciatica, right side: Secondary | ICD-10-CM | POA: Diagnosis not present

## 2016-09-11 NOTE — Progress Notes (Signed)
Office Visit Note   Patient: Donald BeamsBobby Mcgivern Jr.           Date of Birth: 02/03/1966           MRN: [id number] Visit Date: 09/11/2016              Requested by: No referring provider defined for this encounter. PCP: Default, Provider, MD   Assessment & Plan: Visit Diagnoses:  1. Chronic right-sided low back pain with right-sided sciatica     Plan: Patient has not been seen for many years. 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 He did have some foraminal narrowing bilaterally at the bottom level. Since he has such great relief with an epidural back in 2010 will order repeat epidural do not think it is worth the cost to proceed with diagnostic imaging once again. If he continues to have problems after the epidural he'll call.  Follow-Up Instructions: No Follow-up on file.   Orders:  No orders of the defined types were placed in this encounter.  No orders of the defined types were placed in this encounter.     Procedures: No procedures performed   Clinical Data: No additional findings.   Subjective: Chief Complaint  Patient presents with  . Right Hip - Follow-up    Patient states right hip pain, hurts when standing for a while.    His pain is really a buttocks pain and sciatic notch radiates down into the thigh. He's had this problem for more than 10 years. Previous epidural more than 5 years ago giving great relief and he is requesting referral for epidural by Dr. 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 He had taken some anti-inflammatories and states they weren't effective and is also tried Tylenol as well as topical treatments.  Review of  Systems  Constitutional: Negative for chills and diaphoresis.  HENT: Negative for ear discharge, ear pain and nosebleeds.   Eyes: Negative for discharge and visual disturbance.  Respiratory: Negative for cough, choking and shortness of breath.   Cardiovascular: Negative for chest pain and palpitations.       Positive for atypical chest pain which turned out to be reflux.  Gastrointestinal: Negative for abdominal distention and abdominal pain.  Endocrine: Negative for cold intolerance and heat intolerance.  Genitourinary: Negative for flank pain and hematuria.  Skin: Negative for rash and wound.  Neurological: Negative for seizures and speech difficulty.  Hematological: Negative for adenopathy. Does not bruise/bleed easily.  Psychiatric/Behavioral: Negative for agitation and suicidal ideas.     Objective: Vital Signs: BP 137/83   Pulse 63   Ht 6' (1.829 m)   Wt 200 lb (90.7 kg)   BMI 27.12 kg/m   Physical Exam  Constitutional: He is oriented to person, place, and time. He appears well-developed and well-nourished.  HENT:  Head: Normocephalic and atraumatic.  Eyes: EOM are normal. Pupils are equal, round, and reactive to light.  Neck: No tracheal deviation present. No thyromegaly present.  Cardiovascular: Normal rate.   Pulmonary/Chest: Effort normal. He has no wheezes.  Abdominal: Soft. Bowel sounds are normal.  Musculoskeletal:  Patient has some sciatic notch tenderness on the right side. Normal hip range of motion. Knee and  ankle jerk are intact anterior tib EHL is strong. He has some pain getting from sitting to  standing. Distal pulses are intact.  Neurological: He is alert and oriented to person, place, and time.  Skin: Skin is warm and dry. Capillary refill takes less than 2 seconds.  Psychiatric: He has a normal mood and affect. His behavior is normal. Judgment and thought content normal.    Ortho Exam negative Faber test. Pelvis her leg raising 90 positive up to  compression test. Peroneal anterior tib gastrocsoleus EHL are strong.  Specialty Comments:  No specialty comments available.  Imaging: No results found.   PMFS History: Patient Active Problem List   Diagnosis Date Noted  . Other and unspecified hyperlipidemia 12/06/2012  . Unspecified essential hypertension 12/06/2012  . Atypical chest pain 07/13/2012  . GERD (gastroesophageal reflux disease) 12/15/2011   Past Medical History:  Diagnosis Date  . GERD (gastroesophageal reflux disease)   . Hyperlipidemia   . Hypertension     No family history on file.  No past surgical history on file. Social History   Occupational History  . Not on file.   Social History Main Topics  . Smoking status: Never Smoker  . Smokeless tobacco: Never Used  . Alcohol use No  . Drug use: No  . Sexual activity: Not on file

## 2016-09-29 ENCOUNTER — Ambulatory Visit (INDEPENDENT_AMBULATORY_CARE_PROVIDER_SITE_OTHER): Payer: Self-pay | Admitting: Physical Medicine and Rehabilitation

## 2016-09-29 ENCOUNTER — Encounter (INDEPENDENT_AMBULATORY_CARE_PROVIDER_SITE_OTHER): Payer: Self-pay | Admitting: Physical Medicine and Rehabilitation

## 2016-09-29 VITALS — BP 148/85 | HR 95

## 2016-09-29 DIAGNOSIS — Q762 Congenital spondylolisthesis: Secondary | ICD-10-CM

## 2016-09-29 DIAGNOSIS — M5416 Radiculopathy, lumbar region: Secondary | ICD-10-CM

## 2016-09-29 MED ORDER — LIDOCAINE HCL (PF) 1 % IJ SOLN
0.3300 mL | Freq: Once | INTRAMUSCULAR | Status: AC
  Administered 2016-09-29: 0.3 mL

## 2016-09-29 MED ORDER — METHYLPREDNISOLONE ACETATE 80 MG/ML IJ SUSP
80.0000 mg | Freq: Once | INTRAMUSCULAR | Status: AC
  Administered 2016-09-29: 80 mg

## 2016-09-29 NOTE — Patient Instructions (Signed)

## 2016-09-29 NOTE — Procedures (Signed)
Lumbar Epidural Steroid Injection - Interlaminar Approach with Fluoroscopic Guidance  Patient: Donald BeamsBobby Salay Jr.      Date of Birth: [birth year] MRN: [id number] PCP: Default, Provider, MD      Visit Date: 09/29/2016   Universal Protocol:    Date/Time: 01/02/185:01 PM  Consent Given By: the patient  Position: PRONE  Additional Comments: Vital signs were monitored before and after the procedure. Patient was prepped and draped in the usual sterile fashion. The correct patient, procedure, and site was verified.   Injection Procedure Details:  Procedure Site One Meds Administered:  Meds ordered this encounter  Medications  . lidocaine (PF) (XYLOCAINE) 1 % injection 0.3 mL  . methylPREDNISolone acetate (DEPO-MEDROL) injection 80 mg     Laterality: Right  Location/Site:  L5-S1  Needle size: 20 G  Needle type: Tuohy  Needle Placement: Paramedian epidural  Findings:  -Contrast Used: 1 mL iohexol 180 mg iodine/mL   -Comments: Excellent flow of contrast into the epidural space.  Procedure Details: Using a paramedian approach from the side mentioned above, the region overlying the inferior lamina was localized under fluoroscopic visualization and the soft tissues overlying this structure were infiltrated with 4 ml. of 1% Lidocaine without Epinephrine. The Tuohy needle was inserted into the epidural space using a paramedian approach.   The epidural space was localized using loss of resistance along with lateral and bi-planar fluoroscopic views.  After negative aspirate for air, blood, and CSF, a 2 ml. volume of Isovue-250 was injected into the epidural space and the flow of contrast was observed. Radiographs were obtained for documentation purposes.    The injectate was administered into the level noted above.   Additional Comments:  The patient tolerated the procedure well Dressing: Band-Aid    Post-procedure details: Patient was observed during the  procedure. Post-procedure instructions were reviewed.  Patient left the clinic in stable condition.

## 2016-09-29 NOTE — Progress Notes (Signed)
Donald Wyatt. - 51 y.o. male MRN [id number]  Date of birth: Jan 11, 1966  Office Visit Note: Visit Date: 09/29/2016 PCP: Default, Provider, MD Referred by: No ref. provider found  Subjective: Chief Complaint  Patient presents with  . Lower Back - Pain   HPI: Donald Wyatt is a 51 year old gentleman with chronic worsening right side lower back pain for several months and gotten worse recently. Radiating down right leg into toes. Worse with walking and standing long periods. Numbness and tingling at times. Has had an injection by another provider in the past that may have helped. He is very anxious about injections in general. In the future may benefit from preprocedural sedation. Also of note, when didn't finish the procedure he did drive himself home even though he was cautioned not to.    ROS Otherwise per HPI.  Assessment & Plan: Visit Diagnoses:  1. Lumbar radiculopathy   2. Congenital spondylolysis     Plan: Findings:  Right L5-S1 intralaminar epidural steroid injection. Patient with bilateral pars defects and disc herniation above this area.    Meds & Orders:  Meds ordered this encounter  Medications  . lidocaine (PF) (XYLOCAINE) 1 % injection 0.3 mL  . methylPREDNISolone acetate (DEPO-MEDROL) injection 80 mg    Orders Placed This Encounter  Procedures  . Epidural Steroid injection    Follow-up: Return if symptoms worsen or fail to improve in 2 weeks, for follow up with Dr. Ophelia Charter.   Procedures: No procedures performed  Lumbar Epidural Steroid Injection - Interlaminar Approach with Fluoroscopic Guidance  Patient: Donald Wyatt.      Date of Birth: August 08, 51 1967 MRN: [id number] PCP: Default, Provider, MD      Visit Date: 09/29/2016   Universal Protocol:    Date/Time: 01/02/185:01 PM  Consent Given By: the patient  Position: PRONE  Additional Comments: Vital signs were monitored before and after the procedure. Patient was prepped and draped in the usual  sterile fashion. The correct patient, procedure, and site was verified.   Injection Procedure Details:  Procedure Site One Meds Administered:  Meds ordered this encounter  Medications  . lidocaine (PF) (XYLOCAINE) 1 % injection 0.3 mL  . methylPREDNISolone acetate (DEPO-MEDROL) injection 80 mg     Laterality: Right  Location/Site:  L5-S1  Needle size: 20 G  Needle type: Tuohy  Needle Placement: Paramedian epidural  Findings:  -Contrast Used: 1 mL iohexol 180 mg iodine/mL   -Comments: Excellent flow of contrast into the epidural space.  Procedure Details: Using a paramedian approach from the side mentioned above, the region overlying the inferior lamina was localized under fluoroscopic visualization and the soft tissues overlying this structure were infiltrated with 4 ml. of 1% Lidocaine without Epinephrine. The Tuohy needle was inserted into the epidural space using a paramedian approach.   The epidural space was localized using loss of resistance along with lateral and bi-planar fluoroscopic views.  After negative aspirate for air, blood, and CSF, a 2 ml. volume of Isovue-250 was injected into the epidural space and the flow of contrast was observed. Radiographs were obtained for documentation purposes.    The injectate was administered into the level noted above.   Additional Comments:  The patient tolerated the procedure well Dressing: Band-Aid    Post-procedure details: Patient was observed during the procedure. Post-procedure instructions were reviewed.  Patient left the clinic in stable condition.       Clinical History: No specialty comments available.  He reports that he has never smoked.  He has never used smokeless tobacco.   Recent Labs  01/14/16 1727  HGBA1C 5.8    Objective:  VS:  HT:    WT:   BMI:     BP:(!) 148/85  HR:95bpm  TEMP: ( )  RESP:99 % Physical Exam  Musculoskeletal:  Patient ambulates without aid. He has good distal  strength. He has a positive slump test on the right. No clonus.    Ortho Exam Imaging: No results found.  Past Medical/Family/Surgical/Social History: Medications & Allergies reviewed per EMR Patient Active Problem List   Diagnosis Date Noted  . Other and unspecified hyperlipidemia 12/06/2012  . Unspecified essential hypertension 12/06/2012  . Atypical chest pain 07/13/2012  . GERD (gastroesophageal reflux disease) 12/15/2011   Past Medical History:  Diagnosis Date  . GERD (gastroesophageal reflux disease)   . Hyperlipidemia   . Hypertension    History reviewed. No pertinent family history. History reviewed. No pertinent surgical history. Social History   Occupational History  . Not on file.   Social History Main Topics  . Smoking status: Never Smoker  . Smokeless tobacco: Never Used  . Alcohol use No  . Drug use: No  . Sexual activity: Not on file

## 2016-10-13 ENCOUNTER — Ambulatory Visit (HOSPITAL_COMMUNITY)
Admission: EM | Admit: 2016-10-13 | Discharge: 2016-10-13 | Disposition: A | Discharge status: Nursing Home (Includes ICF) | Payer: BC Managed Care – PPO

## 2016-10-13 ENCOUNTER — Emergency Department (HOSPITAL_COMMUNITY)
Admission: EM | Admit: 2016-10-13 | Discharge: 2016-10-13 | Disposition: A | Discharge status: Home / Self Care | Payer: BC Managed Care – PPO | Attending: Emergency Medicine | Admitting: Emergency Medicine

## 2016-10-13 ENCOUNTER — Emergency Department (HOSPITAL_COMMUNITY): Payer: BC Managed Care – PPO

## 2016-10-13 ENCOUNTER — Encounter (HOSPITAL_COMMUNITY): Payer: Self-pay

## 2016-10-13 DIAGNOSIS — Z7982 Long term (current) use of aspirin: Secondary | ICD-10-CM | POA: Insufficient documentation

## 2016-10-13 DIAGNOSIS — I10 Essential (primary) hypertension: Secondary | ICD-10-CM | POA: Insufficient documentation

## 2016-10-13 DIAGNOSIS — R079 Chest pain, unspecified: Secondary | ICD-10-CM | POA: Insufficient documentation

## 2016-10-13 DIAGNOSIS — Z79899 Other long term (current) drug therapy: Secondary | ICD-10-CM | POA: Insufficient documentation

## 2016-10-13 LAB — BASIC METABOLIC PANEL
Anion gap: 7 (ref 5–15)
BUN: 15 mg/dL (ref 6–20)
CHLORIDE: 102 mmol/L (ref 101–111)
CO2: 27 mmol/L (ref 22–32)
CREATININE: 1.07 mg/dL (ref 0.61–1.24)
Calcium: 9.9 mg/dL (ref 8.9–10.3)
GFR calc non Af Amer: >60 mL/min (ref >60)
Glucose, Bld: 100 mg/dL — ABNORMAL HIGH (ref 65–99)
POTASSIUM: 3.8 mmol/L (ref 3.5–5.1)
SODIUM: 136 mmol/L (ref 135–145)

## 2016-10-13 LAB — CBC
HEMATOCRIT: 38.4 % — AB (ref 39.0–52.0)
Hemoglobin: 12.7 g/dL — ABNORMAL LOW (ref 13.0–17.0)
MCH: 22.5 pg — AB (ref 26.0–34.0)
MCHC: 33.1 g/dL (ref 30.0–36.0)
MCV: 68.1 fL — AB (ref 78.0–100.0)
PLATELETS: 215 10*3/uL (ref 150–400)
RBC: 5.64 MIL/uL (ref 4.22–5.81)
RDW: 16.1 % — ABNORMAL HIGH (ref 11.5–15.5)
WBC: 6.5 10*3/uL (ref 4.0–10.5)

## 2016-10-13 LAB — I-STAT TROPONIN, ED: Troponin i, poc: 0.01 ng/mL (ref 0.00–0.08)

## 2016-10-13 NOTE — Discharge Instructions (Signed)
Continue taking your home medications as prescribed. I recommend following up with the cardiology clinic listed below in the next 1-2 weeks for follow-up evaluation. Return to the emergency department if symptoms worsen or new onset of fever, difficulty breathing, heart palpitations, abdominal pain, vomiting, leg swelling, numbness, tingling.

## 2016-10-13 NOTE — ED Triage Notes (Addendum)
Pt c/o intermittent sharp central chest pain starting this morning after strenuous activity.  Pain score 4/10.  Pt denies associated cardiac symptoms.  Pt reports that he has had similar pain in the past.      Pt initially denied HTN and hyperlipidemia, but when reviewing history, Pt sts "I used to have it, but they gave me a pill and it went away."

## 2016-10-13 NOTE — ED Notes (Signed)
Patient advised that he was at [employer] about 4 hours ago and started to have chest pain and was sent for evaluation. Dr. Caron PresumeJ. Kindl was advised and he [facility].

## 2016-10-13 NOTE — ED Provider Notes (Signed)
[facility]-EMERGENCY DEPT Provider Note   CSN: [id number] Arrival date & time: 10/13/16  1042     History   Chief Complaint Chief Complaint  Patient presents with  . Chest Pain    HPI Donald Wyatt. is a 51 y.o. male.  HPI   Patient is a 51 year old male with no pertinent past medical history presents the ED with complaint of chest pain, onset a.m. Patient reports all he was sitting taking and exam for the [employer] this morning he began having intermittent sharp pain to the middle of his chest. He notes this continued for the next few hours until it resolved approximately 20 minutes prior to arrival on his way to the ED. Denies any aggravating or alleviating factors. Denies radiation. Denies taking any medications for symptoms. Patient reports having similar episodes of chest pain over the past few weesk. He notes is a daily and the [employer] and states once he started doing the physical training and written tests he began having episodes of intermittent chest pain similar to his episode today. Patient reports his symptoms will occur at rest or during activity. Denies fever, chills, HA, lightheadedness, dizziness, cough, SOB, wheezing, palpitations, abdominal pain, N/V, numbness, tingling, weakness, leg swelling. Denies personal or family history of cardiac disease. Denies smoking.  Past Medical History:  Diagnosis Date  . GERD (gastroesophageal reflux disease)   . Hyperlipidemia   . Hypertension     Patient Active Problem List   Diagnosis Date Noted  . Other and unspecified hyperlipidemia 12/06/2012  . Unspecified essential hypertension 12/06/2012  . Atypical chest pain 07/13/2012  . GERD (gastroesophageal reflux disease) 12/15/2011    History reviewed. No pertinent surgical history.     Home Medications    Prior to Admission medications   Medication Sig Start Date End Date Taking? Authorizing Provider  aspirin EC 81 MG tablet Take 1 tablet (81 mg total) by mouth  daily. 04/03/14  Yes Peyton Najjar, MD  ibuprofen (ADVIL,MOTRIN) 800 MG tablet Take 1 tablet (800 mg total) by mouth 3 (three) times daily. Patient taking differently: Take 800 mg by mouth every 8 (eight) hours as needed for headache or moderate pain.  04/26/16  Yes Servando Salina, NP  sucralfate (CARAFATE) 1 g tablet TAKE 1 TABLET BY MOUTH FOUR TIMES DAILY WITH MEALS AND AT BEDTIME 04/16/16  Yes Peyton Najjar, MD  atorvastatin (LIPITOR) 40 MG tablet Take 1 tablet (40 mg total) by mouth daily. Patient not taking: Reported on 10/13/2016 01/24/14   Carmelina Dane, MD  celecoxib (CELEBREX) 200 MG capsule TAKE 1 CAPSULE(200 MG) BY MOUTH TWICE DAILY Patient not taking: Reported on 10/13/2016 04/27/16   Porfirio Oar, PA-C  dexlansoprazole (DEXILANT) 60 MG capsule Take 1 capsule (60 mg total) by mouth daily. Patient not taking: Reported on 10/13/2016 04/03/14   Peyton Najjar, MD  sildenafil (VIAGRA) 100 MG tablet Take 1 tablet 30 minutes prior to anticipated intercourse. Patient not taking: Reported on 10/13/2016 01/29/16   Peyton Najjar, MD  tadalafil (CIALIS) 20 MG tablet Take 0.5-1 tablets (10-20 mg total) by mouth every other day as needed for erectile dysfunction. Patient not taking: Reported on 10/13/2016 01/21/16   Peyton Najjar, MD    Family History History reviewed. No pertinent family history.  Social History Social History  Substance Use Topics  . Smoking status: Never Smoker  . Smokeless tobacco: Never Used  . Alcohol use No     Allergies   Chloroquine  Review of Systems Review of Systems  Cardiovascular: Positive for chest pain.  All other systems reviewed and are negative.    Physical Exam Updated Vital Signs BP (!) 159/108 (BP Location: Left Arm)   Pulse 66   Temp 98.2 F (36.8 C) (Oral)   Resp 16   Ht 6' (1.829 m)   SpO2 100%   Physical Exam  Constitutional: He is oriented to person, place, and time. He appears well-developed and well-nourished. No distress.    HENT:  Head: Normocephalic and atraumatic.  Mouth/Throat: Uvula is midline, oropharynx is clear and moist and mucous membranes are normal. No oropharyngeal exudate, posterior oropharyngeal edema, posterior oropharyngeal erythema or tonsillar abscesses. No tonsillar exudate.  Eyes: Conjunctivae and EOM are normal. Right eye exhibits no discharge. Left eye exhibits no discharge. No scleral icterus.  Neck: Normal range of motion. Neck supple.  Cardiovascular: Normal rate, regular rhythm, normal heart sounds and intact distal pulses.   Pulmonary/Chest: Effort normal and breath sounds normal. No respiratory distress. He has no wheezes. He has no rales. He exhibits no tenderness.  Abdominal: Soft. Bowel sounds are normal. He exhibits no distension and no mass. There is no tenderness. There is no rebound and no guarding. No hernia.  Musculoskeletal: Normal range of motion. He exhibits no edema.  Lymphadenopathy:    He has no cervical adenopathy.  Neurological: He is alert and oriented to person, place, and time.  Skin: Skin is warm and dry. He is not diaphoretic.  Nursing note and vitals reviewed.    ED Treatments / Results  Labs (all labs ordered are listed, but only abnormal results are displayed) Labs Reviewed  BASIC METABOLIC PANEL - Abnormal; Notable for the following:       Result Value   Glucose, Bld 100 (*)    All other components within normal limits  CBC - Abnormal; Notable for the following:    Hemoglobin 12.7 (*)    HCT 38.4 (*)    MCV 68.1 (*)    MCH 22.5 (*)    RDW 16.1 (*)    All other components within normal limits  I-STAT TROPOININ, ED    EKG  EKG Interpretation  Date/Time:  Tuesday October 13 2016 10:52:08 EST Ventricular Rate:  69 PR Interval:    QRS Duration: 85 QT Interval:  365 QTC Calculation: 391 R Axis:   37 Text Interpretation:  Sinus rhythm Borderline T abnormalities, inferior leads non specific anterior t wave changes resolved since prior ECG No  significant change was found Confirmed by CAMPOS  MD, Caryn BeeKEVIN ([id number]) on 10/13/2016 12:49:42 PM       Radiology Dg Chest 2 View  Result Date: 10/13/2016 CLINICAL DATA:  Chest pain. EXAM: CHEST  2 VIEW COMPARISON:  01/11/2014. FINDINGS: Mediastinum and hilar structures normal. Heart size normal. No pleural effusion or pneumothorax. No acute bony abnormality. IMPRESSION: No acute cardiopulmonary disease. Electronically Signed   By: Maisie Fushomas  Register   On: 10/13/2016 12:13    Procedures Procedures (including critical care time)  Medications Ordered in ED Medications - No data to display   Initial Impression / Assessment and Plan / ED Course  I have reviewed the triage vital signs and the nursing notes.  Pertinent labs & imaging results that were available during my care of the patient were reviewed by me and considered in my medical decision making (see chart for details).  Clinical Course     Patient presents with episode of chest pain that started around 8  AM all he was taking a written exam. 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 No chest pain since arrival to the ED. Denies associated symptoms. Denies personal or family history of cardiac disease. VSS. Exam unremarkable. EKG showed sinus rhythm with no acute ischemic findings, no significant changes from prior. Troponin negative. Chest x-ray negative. HEART score 2. I have a low suspicion for ACS, PE, dissection, or other acute cardiac event at this time. Discussed pt with Dr. Patria Mane. Plan to d/c pt home with outpatient cardiology follow up. Discussed results and discharge the patient. Discussed return precautions.  Final Clinical Impressions(s) / ED Diagnoses   Final diagnoses:  Chest pain, unspecified type    New Prescriptions New Prescriptions   No medications on file     Barrett Henle, PA-C 10/13/16 1308    Azalia Bilis, MD 10/13/16  1743

## 2016-11-10 ENCOUNTER — Encounter: Payer: Self-pay | Admitting: Physician Assistant

## 2016-11-20 ENCOUNTER — Ambulatory Visit: Payer: BC Managed Care – PPO

## 2016-12-01 DIAGNOSIS — I10 Essential (primary) hypertension: Secondary | ICD-10-CM

## 2017-09-17 ENCOUNTER — Encounter: Payer: Self-pay | Admitting: Physician Assistant

## 2017-12-02 ENCOUNTER — Ambulatory Visit (INDEPENDENT_AMBULATORY_CARE_PROVIDER_SITE_OTHER): Payer: Self-pay

## 2017-12-02 ENCOUNTER — Encounter (INDEPENDENT_AMBULATORY_CARE_PROVIDER_SITE_OTHER): Payer: Self-pay | Admitting: Surgery

## 2017-12-02 ENCOUNTER — Ambulatory Visit (INDEPENDENT_AMBULATORY_CARE_PROVIDER_SITE_OTHER): Payer: Self-pay | Admitting: Surgery

## 2017-12-02 DIAGNOSIS — M545 Low back pain: Secondary | ICD-10-CM

## 2017-12-02 DIAGNOSIS — G8929 Other chronic pain: Secondary | ICD-10-CM

## 2017-12-02 MED ORDER — METHYLPREDNISOLONE 4 MG PO TABS: ORAL_TABLET | ORAL | 0 refills | Status: DC

## 2017-12-02 NOTE — Progress Notes (Signed)
Office Visit Note   Patient: Donald BeamsBobby Lazard Jr.           Date of Birth: 08-Sep-1966           MRN: [id number] Visit Date: 12/02/2017              Requested by: No referring provider defined for this encounter. PCP: Default, Provider, MD   Assessment & Plan: Visit Diagnoses:  1. Chronic right-sided low back pain, with sciatica presence unspecified     Plan: At this point will treat conservatively with Medrol Dosepak 6-day taper to be taken as directed.  Patient will follow-up in the office in 3 weeks for recheck but advised him to call me a week before his appointment to let me know how he is feeling.  If he continues to have ongoing symptoms I will plan to schedule lumbar spine MRI compared to study that was done in 2010.  All questions answered.  Follow-Up Instructions: Return in about 3 weeks (around 12/23/2017) for Dr. Ophelia CharterYates.   Orders:  Orders Placed This Encounter  Procedures  . XR Lumbar Spine 2-3 Views   Meds ordered this encounter  Medications  . methylPREDNISolone (MEDROL) 4 MG tablet    Sig: 6-day taper to be taken as directed    Dispense:  21 tablet    Refill:  0      Procedures: No procedures performed   Clinical Data: No additional findings.   Subjective: Chief Complaint  Patient presents with  . Right Hip - Pain    HPI Patient presents today with complaints of right lateral hip/thigh pain numbness and tingling.  States that this is been progressively getting worse since December 2018.  No injury.  Patient has known history of lumbar spine issues and MRI from March 27, 2009 impression read: IMPRESSION: Broad-based disc herniation and L4-5.  Facet and ligamentous hypertrophy.  Spinal stenosis at this level that could cause neural compression on either side.   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  Annular tearing and shallow broad-based disc protrusion.  Stenosis of both lateral recesses that could cause neural compression.  Current  symptoms aggravated with prolonged standing.  No symptoms on the left side.  Review of Systems No current cardiac pulmonary GI GU issues  Objective: Vital Signs: There were no vitals taken for this visit.  Physical Exam  Constitutional: He is oriented to person, place, and time. He appears well-developed. No distress.  HENT:  Head: Normocephalic and atraumatic.  Eyes: EOM are normal. Pupils are equal, round, and reactive to light.  Neck: Normal range of motion.  Abdominal: He exhibits no distension.  Musculoskeletal:  Patient has mild right-sided lumbar paraspinal tenderness.  Negative logroll.  Negative straight leg raise.  No focal motor deficits.  Neuro vas intact.  Neurological: He is alert and oriented to person, place, and time.  Skin: Skin is warm and dry.  Psychiatric: He has a normal mood and affect.    Ortho Exam  Specialty Comments:  No specialty comments available.  Imaging: No results found.   PMFS History: Patient Active Problem List   Diagnosis Date Noted  . Other and unspecified hyperlipidemia 12/06/2012  . Essential hypertension 12/06/2012  . Atypical chest pain 07/13/2012  . GERD (gastroesophageal reflux disease) 12/15/2011   Past Medical History:  Diagnosis Date  . GERD (gastroesophageal reflux disease)   . Hyperlipidemia   . Hypertension     History reviewed. No pertinent family history.  History reviewed. No  pertinent surgical history. Social History   Occupational History  . Not on file  Tobacco Use  . Smoking status: Never Smoker  . Smokeless tobacco: Never Used  Substance and Sexual Activity  . Alcohol use: No  . Drug use: No  . Sexual activity: Not on file

## 2017-12-16 ENCOUNTER — Telehealth (INDEPENDENT_AMBULATORY_CARE_PROVIDER_SITE_OTHER): Payer: Self-pay | Admitting: Orthopaedic Surgery

## 2017-12-16 NOTE — Telephone Encounter (Signed)
Per last office note, you wanted patient to call you one week prior to visit. Please advise.

## 2017-12-16 NOTE — Telephone Encounter (Signed)
Patient left a message asking for a call back from you # [phone]

## 2017-12-22 NOTE — Telephone Encounter (Signed)
Please schedule MRI lumbar spine to rule out HNP/stenosis.  Diagnosis worsening low back pain and right lower extremity radiculopathy, failed conservative treatment.  Needs return office visit with Dr. Ophelia CharterYates after to discuss results and treatment options.

## 2017-12-24 NOTE — Telephone Encounter (Signed)
FYI.. I called patient and he states that he does not have the money for another MRI. I explained that this was being ordered because his pain has been worsening and to compare to the one in 2010.  Patient states that we have everything on record here and he does not feel that he needs another scan. He has follow up appt scheduled with Dr. Ophelia CharterYates on 01/10/18.  He wants to defer MRI at this time.

## 2017-12-27 NOTE — Telephone Encounter (Signed)
I recommend holding off on rescheduling appointment until you have advised Dr Ophelia CharterYates of patient's message.  Needs MRI as I recommended.

## 2017-12-27 NOTE — Telephone Encounter (Signed)
I left voicemail for patient advising.  Fyi. Please see messages below.

## 2017-12-31 NOTE — Telephone Encounter (Signed)
noted 

## 2017-12-31 NOTE — Telephone Encounter (Signed)
Ok [facility] FYI

## 2018-01-11 ENCOUNTER — Ambulatory Visit (INDEPENDENT_AMBULATORY_CARE_PROVIDER_SITE_OTHER): Payer: Self-pay | Admitting: Orthopaedic Surgery

## 2018-01-19 ENCOUNTER — Ambulatory Visit (INDEPENDENT_AMBULATORY_CARE_PROVIDER_SITE_OTHER): Payer: Self-pay | Admitting: Orthopaedic Surgery

## 2018-02-05 ENCOUNTER — Ambulatory Visit (HOSPITAL_COMMUNITY)
Admission: EM | Admit: 2018-02-05 | Discharge: 2018-02-05 | Disposition: A | Discharge status: Home / Self Care | Payer: BC Managed Care – PPO | Attending: Family Medicine | Admitting: Family Medicine

## 2018-02-05 ENCOUNTER — Encounter (HOSPITAL_COMMUNITY): Payer: Self-pay | Admitting: Emergency Medicine

## 2018-02-05 DIAGNOSIS — B9789 Other viral agents as the cause of diseases classified elsewhere: Secondary | ICD-10-CM

## 2018-02-05 DIAGNOSIS — J301 Allergic rhinitis due to pollen: Secondary | ICD-10-CM

## 2018-02-05 DIAGNOSIS — J069 Acute upper respiratory infection, unspecified: Secondary | ICD-10-CM

## 2018-02-05 MED ORDER — NAPROXEN 375 MG PO TABS: 375.0000 mg | ORAL_TABLET | Freq: Two times a day (BID) | ORAL | 0 refills | Status: DC

## 2018-02-05 MED ORDER — FLUTICASONE PROPIONATE 50 MCG/ACT NA SUSP: 2.0000 | Freq: Every day | NASAL | 0 refills | Status: DC

## 2018-02-05 MED ORDER — CETIRIZINE-PSEUDOEPHEDRINE ER 5-120 MG PO TB12: 1.0000 | ORAL_TABLET | Freq: Every day | ORAL | 0 refills | Status: DC

## 2018-02-05 MED ORDER — BENZONATATE 100 MG PO CAPS: 100.0000 mg | ORAL_CAPSULE | Freq: Three times a day (TID) | ORAL | 0 refills | Status: DC | PRN

## 2018-02-05 NOTE — ED Provider Notes (Signed)
[facility]   [id number] 02/05/18 Arrival Time: 1202  SUBJECTIVE:  Donald Wyatt is a 52 y.o. male who presents with cough and congestion for 2 days .  Admits to sick exposure.  Describes cough as intermittent and dry.  Has tried OTC tylenol without relief.  Symptoms are made worse with pollen.  Reports previous symptoms in the past around this time of year.   Complains body aches, headache, rhinorrhea, sinus pain, and sore throat.  Denies fever, chills, SOB, wheezing, chest pain, nausea, vomiting, changes in bowel or bladder habits.    ROS: As per HPI.  Past Medical History:  Diagnosis Date  . GERD (gastroesophageal reflux disease)   . Hyperlipidemia   . Hypertension    History reviewed. No pertinent surgical history. Allergies  Allergen Reactions  . Chloroquine Itching   Outpatient Medications   aspirin EC 81 MG tablet    atorvastatin (LIPITOR) 40 MG tablet    celecoxib (CELEBREX) 200 MG capsule    dexlansoprazole (DEXILANT) 60 MG capsule    ibuprofen (ADVIL,MOTRIN) 800 MG tablet    methylPREDNISolone (MEDROL) 4 MG tablet    sildenafil (VIAGRA) 100 MG tablet    sucralfate (CARAFATE) 1 g tablet    tadalafil (CIALIS) 20 MG tablet     Social History   Socioeconomic History  . Marital status: Single    Spouse name: Not on file  . Number of children: Not on file  . Years of education: Not on file  . Highest education level: Not on file  Occupational History  . Not on file  Social Needs  . Financial resource strain: Not on file  . Food insecurity:    Worry: Not on file    Inability: Not on file  . Transportation needs:    Medical: Not on file    Non-medical: Not on file  Tobacco Use  . Smoking status: Never Smoker  . Smokeless tobacco: Never Used  Substance and Sexual Activity  . Alcohol use: No  . Drug use: No  . Sexual activity: Not on file  Lifestyle  . Physical activity:    Days per week: Not on file    Minutes per session: Not on file  .  Stress: Not on file  Relationships  . Social connections:    Talks on phone: Not on file    Gets together: Not on file    Attends religious service: Not on file    Active member of club or organization: Not on file    Attends meetings of clubs or organizations: Not on file    Relationship status: Not on file  . Intimate partner violence:    Fear of current or ex partner: Not on file    Emotionally abused: Not on file    Physically abused: Not on file    Forced sexual activity: Not on file  Other Topics Concern  . Not on file  Social History Narrative  . Not on file   History reviewed. No pertinent family history.   OBJECTIVE:  Vitals:   02/05/18 1224  BP: 122/80  Pulse: 69  Resp: 18  Temp: 98.4 F (36.9 C)  TempSrc: Oral  SpO2: 98%     General appearance: AOx3 in no acute distress HEENT: PERRL.  EOM grossly intact.  Mild sinuses tenderness; mild clear rhinorrhea; tonsils nonerythematous, uvula midline Neck: supple without LAD Lungs: clear to auscultation bilaterally without adventitious breath sounds Heart: regular rate and rhythm.  Radial pulses 2+ symmetrical bilaterally  Skin: warm and dry Psychological: alert and cooperative; normal mood and affect  ASSESSMENT & PLAN:  1. Viral URI with cough     Meds ordered this encounter  Medications  . cetirizine-pseudoephedrine (ZYRTEC-D) 5-120 MG tablet    Sig: Take 1 tablet by mouth daily.    Dispense:  30 tablet    Refill:  0    Order Specific Question:   Supervising Provider    Answer:   Isa Rankin [phone]  . benzonatate (TESSALON) 100 MG capsule    Sig: Take 1 capsule (100 mg total) by mouth 3 (three) times daily as needed for cough.    Dispense:  21 capsule    Refill:  0    Order Specific Question:   Supervising Provider    Answer:   Isa Rankin [phone]  . naproxen (NAPROSYN) 375 MG tablet    Sig: Take 1 tablet (375 mg total) by mouth 2 (two) times daily.    Dispense:  20 tablet     Refill:  0    Order Specific Question:   Supervising Provider    Answer:   Isa Rankin [phone]  . fluticasone (FLONASE) 50 MCG/ACT nasal spray    Sig: Place 2 sprays into both nostrils daily.    Dispense:  1 g    Refill:  0    Order Specific Question:   Supervising Provider    Answer:   Isa Rankin [[id number]]   Get plenty of rest and push fluids Tessalon Perles prescribed as needed for symptomatic relief of cough Zyrtec-D prescribed take as directed Naproxen prescribed for body aches Flonase nasal spray as needed for nasal congestion and runny nose Follow up with PCP if symptoms persist Return or go to ER if you have any new or worsening symptoms    Reviewed expectations re: course of current medical issues. Questions answered. Outlined signs and symptoms indicating need for more acute intervention. Patient verbalized understanding. After Visit Summary given.          Rennis Harding, PA-C 02/05/18 1252

## 2018-02-05 NOTE — ED Triage Notes (Signed)
Pt sts URI sx with cough and congestion; pt sts body aches

## 2018-02-05 NOTE — Discharge Instructions (Addendum)
Get plenty of rest and push fluids Tessalon Perles prescribed as needed for symptomatic relief of cough Zyrtec-D prescribed take as directed Naproxen prescribed for body aches Flonase nasal spray as needed for nasal congestion and runny nose Follow up with PCP if symptoms persist Return or go to ER if you have any new or worsening symptoms

## 2018-03-15 ENCOUNTER — Encounter (INDEPENDENT_AMBULATORY_CARE_PROVIDER_SITE_OTHER): Payer: Self-pay | Admitting: Orthopaedic Surgery

## 2018-03-15 ENCOUNTER — Ambulatory Visit (INDEPENDENT_AMBULATORY_CARE_PROVIDER_SITE_OTHER): Payer: BLUE CROSS/BLUE SHIELD | Admitting: Orthopaedic Surgery

## 2018-03-15 VITALS — BP 121/79 | HR 65 | Ht 72.0 in | Wt 202.0 lb

## 2018-03-15 DIAGNOSIS — M48062 Spinal stenosis, lumbar region with neurogenic claudication: Secondary | ICD-10-CM | POA: Diagnosis not present

## 2018-03-15 DIAGNOSIS — M5126 Other intervertebral disc displacement, lumbar region: Secondary | ICD-10-CM | POA: Insufficient documentation

## 2018-03-15 MED ORDER — ACETAMINOPHEN-CODEINE #3 300-30 MG PO TABS: 1.0000 | ORAL_TABLET | Freq: Two times a day (BID) | ORAL | 0 refills | Status: DC | PRN

## 2018-03-15 NOTE — Addendum Note (Signed)
Addended by: Rogers SeedsYEATTS, Mackinley Cassaday M on: 03/15/2018 09:04 AM   Modules accepted: Orders

## 2018-03-15 NOTE — Progress Notes (Signed)
Office Visit Note   Patient: Donald Wyatt           Date of Birth: 13-Dec-1965           MRN: [id number] Visit Date: 03/15/2018              Requested by: No referring provider defined for this encounter. PCP: System, Provider Not In   Assessment & Plan: Visit Diagnoses:  1. Spinal stenosis of lumbar region with neurogenic claudication   2. Lumbar disc herniation     Plan: We will set him up for another lumbar epidural injection on the right side.  If he does not respond then will have to consider repeating his MRI imaging studies.  Follow-Up Instructions: Return in about 2 months (around 05/15/2018).   Orders:  No orders of the defined types were placed in this encounter.  No orders of the defined types were placed in this encounter.     Procedures: No procedures performed   Clinical Data: No additional findings.   Subjective: Chief Complaint  Patient presents with  . Lower Back - Pain  . Right Hip - Pain    HPI 52 year old male works for the [employer] at [employer] and is here for recurrent problems with back and right leg pain.  He gets numbness that radiates down his right leg and states it stops at his knee.  He has increased pain with standing and can only stand for 5 to 10 minutes.  He gets relief when he sits down does better if he is leaning forward.  He gets relief from the supine position.  Previous epidural last January 2018 gave him a lot of relief for 6 months.  He said gradual recurrence over the last few months and he states this is progressing.  He has some trouble sleeping at times.  He denies associated bowel or bladder symptoms no chills or fever.  Patient had prednisone pack in March of this year with temporary relief then recurrence of symptoms.  Patient requesting a repeat epidural since the injection last year gave him great relief for greater than 6 months. Previous MRI 2010 showed broad-based disc herniation with some short  pedicles.  He had some moderate spinal stenosis from a combination of congenital narrowing as well as broad-based disc herniation with some ligamentum thickening.  Bilateral pars defects were present at L5 without spondylolisthesis.  Patient had annular tear on the right at L5-S1 with high intensity zone without significant compression but he does have some stenosis at L5-S1 as well but not as severe at L4-5.  He has not had a new scan since 2010. Review of Systems reviewed updated unchanged from 12/02/2017 office visit.   Objective: Vital Signs: BP 121/79   Pulse 65   Ht 6' (1.829 m)   Wt 202 lb (91.6 kg)   BMI 27.40 kg/m   Physical Exam  Constitutional: He is oriented to person, place, and time. He appears well-developed and well-nourished.  HENT:  Head: Normocephalic and atraumatic.  Eyes: Pupils are equal, round, and reactive to light. EOM are normal.  Neck: No tracheal deviation present. No thyromegaly present.  Cardiovascular: Normal rate.  Pulmonary/Chest: Effort normal. He has no wheezes.  Abdominal: Soft. Bowel sounds are normal.  Neurological: He is alert and oriented to person, place, and time.  Skin: Skin is warm and dry. Capillary refill takes less than 2 seconds.  Psychiatric: He has a normal mood and affect. His behavior is normal. Judgment  and thought content normal.    Ortho Exam patient has some sciatic notch tenderness on the right side.  He is able to heel and toe walk no isolated motor weakness.  Negative logroll to the hips.  Knees reach full extension no atrophy no rash over exposed skin.  Reflexes are intact.  Specialty Comments:  No specialty comments available.  Imaging: No results found.   PMFS History: Patient Active Problem List   Diagnosis Date Noted  . Other and unspecified hyperlipidemia 12/06/2012  . Essential hypertension 12/06/2012  . Atypical chest pain 07/13/2012  . GERD (gastroesophageal reflux disease) 12/15/2011   Past Medical History:    Diagnosis Date  . GERD (gastroesophageal reflux disease)   . Hyperlipidemia   . Hypertension     No family history on file.  No past surgical history on file. Social History   Occupational History  . Not on file  Tobacco Use  . Smoking status: Never Smoker  . Smokeless tobacco: Never Used  Substance and Sexual Activity  . Alcohol use: No  . Drug use: No  . Sexual activity: Not on file

## 2018-03-15 NOTE — Addendum Note (Signed)
Addended by: Rogers SeedsYEATTS, Lorella Gomez M on: 03/15/2018 09:23 AM   Modules accepted: Orders

## 2018-04-12 ENCOUNTER — Telehealth (INDEPENDENT_AMBULATORY_CARE_PROVIDER_SITE_OTHER): Payer: Self-pay | Admitting: *Deleted

## 2018-04-12 NOTE — Telephone Encounter (Signed)
Pt called wanting to r/s appt for 7/17. Called pt and left 3 vm today.

## 2018-04-13 ENCOUNTER — Encounter (INDEPENDENT_AMBULATORY_CARE_PROVIDER_SITE_OTHER): Payer: Self-pay | Admitting: Physical Medicine and Rehabilitation

## 2018-04-20 ENCOUNTER — Telehealth (INDEPENDENT_AMBULATORY_CARE_PROVIDER_SITE_OTHER): Payer: Self-pay | Admitting: *Deleted

## 2018-04-20 NOTE — Telephone Encounter (Signed)
Pt came into office and r/s appt to 05/09/18 and confirmed that he was off work on 05/09/18 and will have a driver.

## 2018-05-09 ENCOUNTER — Ambulatory Visit (INDEPENDENT_AMBULATORY_CARE_PROVIDER_SITE_OTHER): Payer: BLUE CROSS/BLUE SHIELD | Admitting: Physical Medicine and Rehabilitation

## 2018-05-09 ENCOUNTER — Encounter (INDEPENDENT_AMBULATORY_CARE_PROVIDER_SITE_OTHER): Payer: Self-pay | Admitting: Physical Medicine and Rehabilitation

## 2018-05-09 ENCOUNTER — Ambulatory Visit (INDEPENDENT_AMBULATORY_CARE_PROVIDER_SITE_OTHER): Payer: Self-pay

## 2018-05-09 VITALS — BP 130/96 | HR 54

## 2018-05-09 DIAGNOSIS — M5416 Radiculopathy, lumbar region: Secondary | ICD-10-CM

## 2018-05-09 DIAGNOSIS — Q762 Congenital spondylolisthesis: Secondary | ICD-10-CM

## 2018-05-09 MED ORDER — METHYLPREDNISOLONE ACETATE 80 MG/ML IJ SUSP
80.0000 mg | Freq: Once | INTRAMUSCULAR | Status: AC
  Administered 2018-05-09: 80 mg

## 2018-05-09 NOTE — Progress Notes (Signed)
Donald Wyatt - 52 y.o. male MRN [id number]  Date of birth: 11-Jul-1966  Office Visit Note: Visit Date: 05/09/2018 PCP: System, Provider Not In Referred by: No ref. provider found  Subjective: Chief Complaint  Patient presents with  . Lower Back - Pain  . Right Hip - Pain   HPI: Mr. Donald Wyatt is a 52 year old gentleman that works [place] the [employer] [place] [employer] [employer] [place] and who comes in today at the request of Dr. Annell GreeningMark Yates for repeat L5-S1 interlaminar epidural injection on the right.  He is having right low back and hip and thigh pain.  Worse with standing.  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  Prior injection was very beneficial.  Last MRI was from 2010 showed disc protrusion as well as some stenosis and bilateral pars defects without much listhesis.  He has had no new trauma.  We are going to repeat the injection since it helps.  On the differential diagnosis list should be a meralgia paresthetica of the right anterior thigh.  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  I do not think that is all of his pain I do think he is having some back pain from his spine but in terms of the paresthesia in the thigh should be a consideration.  Nonetheless the prior injection did seem to help quite a bit.   ROS Otherwise per HPI.  Assessment & Plan: Visit Diagnoses:  1. Lumbar radiculopathy   2. Congenital spondylolysis     Plan: No additional findings.   Meds & Orders:  Meds ordered this encounter  Medications  . methylPREDNISolone acetate (DEPO-MEDROL) injection 80 mg    Orders Placed This Encounter  Procedures  . XR C-ARM NO REPORT  . Epidural Steroid injection    Follow-up: Return if symptoms worsen or fail to improve.    Procedures: No procedures performed  Lumbar Epidural Steroid Injection - Interlaminar Approach with Fluoroscopic Guidance  Patient: Donald Wyatt      Date of Birth: 11-Jul-1966 MRN: [id number] PCP: System, Provider Not In      Visit Date: 05/09/2018   Universal Protocol:     Consent Given By: the patient  Position: PRONE  Additional Comments: Vital signs were monitored before and after the procedure. Patient was prepped and draped in the usual sterile fashion. The correct patient, procedure, and site was verified.   Injection Procedure Details:  Procedure Site One Meds Administered:  Meds ordered this encounter  Medications  . methylPREDNISolone acetate (DEPO-MEDROL) injection 80 mg     Laterality: Right  Location/Site:  L5-S1  Needle size: 20 G  Needle type: Tuohy  Needle Placement: Paramedian epidural  Findings:   -Comments: Excellent flow of contrast into the epidural space.  Procedure Details: Using a paramedian approach from the side mentioned above, the region overlying the inferior lamina was localized under fluoroscopic visualization and the soft tissues overlying this structure were infiltrated with 4 ml. of 1% Lidocaine without Epinephrine. The Tuohy needle was inserted into the epidural space using a paramedian approach.   The epidural space was localized using loss of resistance along with lateral and bi-planar fluoroscopic views.  After negative aspirate for air, blood, and CSF, a 2 ml. volume of Isovue-250 was  injected into the epidural space and the flow of contrast was observed. Radiographs were obtained for documentation purposes.    The injectate was administered into the level noted above.   Additional Comments:  The patient tolerated the procedure well Dressing: Band-Aid    Post-procedure details: Patient was observed during the procedure. Post-procedure instructions were reviewed.  Patient left the clinic in stable condition.    Clinical History: No specialty comments available.   He reports that he has never smoked. He has never used smokeless tobacco. No results for input(s): HGBA1C, LABURIC in the last 8760 hours.  Objective:  VS:  HT:    WT:   BMI:     BP:(!) 130/96  HR:(!) 54bpm  TEMP: ( )  RESP:  Physical Exam  Ortho Exam Imaging: Xr C-arm No Report  Result Date: 05/09/2018 Please see Notes tab for imaging impression.   Past Medical/Family/Surgical/Social History: Medications & Allergies reviewed per EMR, new medications updated. Patient Active Problem List   Diagnosis Date Noted  . Spinal stenosis of lumbar region with neurogenic claudication 03/15/2018  . Lumbar disc herniation 03/15/2018  . Other and unspecified hyperlipidemia 12/06/2012  . Essential hypertension 12/06/2012  . Atypical chest pain 07/13/2012  . GERD (gastroesophageal reflux disease) 12/15/2011   Past Medical History:  Diagnosis Date  . GERD (gastroesophageal reflux disease)   . Hyperlipidemia   . Hypertension    History reviewed. No pertinent family history. History reviewed. No pertinent surgical history. Social History   Occupational History  . Not on file  Tobacco Use  . Smoking status: Never Smoker  . Smokeless tobacco: Never Used  Substance and Sexual Activity  . Alcohol use: No  . Drug use: No  . Sexual activity: Not on file

## 2018-05-09 NOTE — Patient Instructions (Signed)

## 2018-05-09 NOTE — Procedures (Signed)
Lumbar Epidural Steroid Injection - Interlaminar Approach with Fluoroscopic Guidance  Patient: Donald Wyatt      Date of Birth: 1966/05/23 MRN: [id number] PCP: System, Provider Not In      Visit Date: 05/09/2018   Universal Protocol:     Consent Given By: the patient  Position: PRONE  Additional Comments: Vital signs were monitored before and after the procedure. Patient was prepped and draped in the usual sterile fashion. The correct patient, procedure, and site was verified.   Injection Procedure Details:  Procedure Site One Meds Administered:  Meds ordered this encounter  Medications  . methylPREDNISolone acetate (DEPO-MEDROL) injection 80 mg     Laterality: Right  Location/Site:  L5-S1  Needle size: 20 G  Needle type: Tuohy  Needle Placement: Paramedian epidural  Findings:   -Comments: Excellent flow of contrast into the epidural space.  Procedure Details: Using a paramedian approach from the side mentioned above, the region overlying the inferior lamina was localized under fluoroscopic visualization and the soft tissues overlying this structure were infiltrated with 4 ml. of 1% Lidocaine without Epinephrine. The Tuohy needle was inserted into the epidural space using a paramedian approach.   The epidural space was localized using loss of resistance along with lateral and bi-planar fluoroscopic views.  After negative aspirate for air, blood, and CSF, a 2 ml. volume of Isovue-250 was injected into the epidural space and the flow of contrast was observed. Radiographs were obtained for documentation purposes.    The injectate was administered into the level noted above.   Additional Comments:  The patient tolerated the procedure well Dressing: Band-Aid    Post-procedure details: Patient was observed during the procedure. Post-procedure instructions were reviewed.  Patient left the clinic in stable condition.

## 2018-05-09 NOTE — Progress Notes (Signed)
 .  Numeric Pain Rating Scale and Functional Assessment Average Pain 8   In the last MONTH (on 0-10 scale) has pain interfered with the following?  1. General activity like being  able to carry out your everyday physical activities such as walking, climbing stairs, carrying groceries, or moving a chair?  Rating(6)   +Driver, -BT, -Dye Allergies.  

## 2018-05-12 ENCOUNTER — Encounter (INDEPENDENT_AMBULATORY_CARE_PROVIDER_SITE_OTHER): Payer: Self-pay | Admitting: Physical Medicine and Rehabilitation

## 2018-12-15 ENCOUNTER — Telehealth: Payer: Self-pay | Admitting: Family Medicine

## 2018-12-15 NOTE — Telephone Encounter (Signed)
Copied from CRM [id number]. Topic: Quick Communication - Rx Refill/Question >> Dec 15, 2018 11:44 AM Burchel, Abbi R wrote: Medication: lansoprazole (PREVACID) 30 MG capsule, dexlansoprazole (DEXILANT) 60 MG capsule  Pt has appt on Monday, 12/19/2018.  Pt is requesting emergency supply of medication to get him to appt.   Preferred Pharmacy: [id number] [place], [place] - [address] AT [facility] [phone] (Phone) [phone] (Fax)    Pt was advised that RX refills may take up to 3 business days. We ask that you follow-up with your pharmacy.

## 2018-12-16 NOTE — Telephone Encounter (Signed)
Spoke with pt via phone advised he will need est.care w/ another provider before refills can be given.  Cancelled appt on 12/19/2018 with greene(not taking any new pt's) and rescheduled with stallings for 12/26/2018 at 8:20 am.  Advised pt appt will be to establish care with new provider and med refills.  Pt agreeable. Dgaddy, CMA  FYI

## 2018-12-19 ENCOUNTER — Ambulatory Visit: Payer: BLUE CROSS/BLUE SHIELD | Admitting: Family Medicine

## 2018-12-26 ENCOUNTER — Telehealth: Payer: Self-pay | Admitting: Family Medicine

## 2018-12-27 ENCOUNTER — Ambulatory Visit (INDEPENDENT_AMBULATORY_CARE_PROVIDER_SITE_OTHER): Payer: Self-pay

## 2018-12-27 ENCOUNTER — Other Ambulatory Visit: Payer: Self-pay

## 2018-12-27 ENCOUNTER — Telehealth (INDEPENDENT_AMBULATORY_CARE_PROVIDER_SITE_OTHER): Payer: Self-pay | Admitting: Family Medicine

## 2018-12-27 ENCOUNTER — Ambulatory Visit (INDEPENDENT_AMBULATORY_CARE_PROVIDER_SITE_OTHER): Payer: Self-pay | Admitting: Family Medicine

## 2018-12-27 DIAGNOSIS — R7611 Nonspecific reaction to tuberculin skin test without active tuberculosis: Secondary | ICD-10-CM

## 2018-12-27 DIAGNOSIS — Z9229 Personal history of other drug therapy: Secondary | ICD-10-CM

## 2018-12-27 DIAGNOSIS — I10 Essential (primary) hypertension: Secondary | ICD-10-CM

## 2018-12-27 DIAGNOSIS — Z111 Encounter for screening for respiratory tuberculosis: Secondary | ICD-10-CM

## 2018-12-27 DIAGNOSIS — M75112 Incomplete rotator cuff tear or rupture of left shoulder, not specified as traumatic: Secondary | ICD-10-CM

## 2018-12-27 DIAGNOSIS — Z23 Encounter for immunization: Secondary | ICD-10-CM

## 2018-12-27 DIAGNOSIS — K219 Gastro-esophageal reflux disease without esophagitis: Secondary | ICD-10-CM

## 2018-12-27 MED ORDER — ATORVASTATIN CALCIUM 40 MG PO TABS: 40.0000 mg | ORAL_TABLET | Freq: Every day | ORAL | 1 refills | Status: AC

## 2018-12-27 MED ORDER — CETIRIZINE HCL 10 MG PO TABS: 10.0000 mg | ORAL_TABLET | Freq: Every day | ORAL | 11 refills | Status: AC

## 2018-12-27 MED ORDER — FLUTICASONE PROPIONATE 50 MCG/ACT NA SUSP: 2.0000 | Freq: Every day | NASAL | 11 refills | Status: AC

## 2018-12-27 MED ORDER — LANSOPRAZOLE 30 MG PO CPDR: DELAYED_RELEASE_CAPSULE | ORAL | 11 refills | Status: AC

## 2018-12-27 MED ORDER — MELOXICAM 15 MG PO TABS: 15.0000 mg | ORAL_TABLET | Freq: Every day | ORAL | 5 refills | Status: AC

## 2018-12-27 NOTE — Patient Instructions (Signed)
° ° ° °  If you have lab work done today you will be contacted with your lab results within the next 2 weeks.  If you have not heard from us then please contact us. The fastest way to get your results is to register for My Chart. ° ° °IF you received an x-ray today, you will receive an invoice from [facility]. Please contact [facility] at [phone] with questions or concerns regarding your invoice.  ° °IF you received labwork today, you will receive an invoice from [facility]. Please contact [facility] at [phone] with questions or concerns regarding your invoice.  ° °Our billing staff will not be able to assist you with questions regarding bills from these companies. ° °You will be contacted with the lab results as soon as they are available. The fastest way to get your results is to activate your My Chart account. Instructions are located on the last page of this paperwork. If you have not heard from us regarding the results in 2 weeks, please contact this office. °  ° ° ° °

## 2018-12-27 NOTE — Progress Notes (Signed)
Telemedicine Encounter- SOAP NOTE Established Patient  This telephone encounter was conducted with the patient's (or proxy's) verbal consent via audio telecommunications: yes/no: Yes Patient was instructed to have this encounter in a suitably private space; and to only have persons present to whom they give permission to participate. In addition, patient identity was confirmed by use of name plus two identifiers (DOB and address).  I discussed the limitations, risks, security and privacy concerns of performing an evaluation and management service by telephone and the availability of in person appointments. I also discussed with the patient that there may be a patient responsible charge related to this service. The patient expressed understanding and agreed to proceed.  I spent a total of TIME; 0 MIN TO 60 MIN: 20 minutes talking with the patient or their proxy.  No chief complaint on file.   Subjective   Donald Wyatt is a 53 y.o. established patient. Telephone visit today for  HPI Left shoulder pain he was coming down the steps and slid  He grabbed the rail and prevented him from falling  He states that he had evaluation and had meds prescribed Workman's compensation denied the claim  This was November 2019  He reinjured the shoulder while sleeping  He has pain with turning the steering wheel when driving or turning the keys or trying to remove his shirt Trying to get dressed is also painful  He denies numbness in his hands He denies weakness in the arm Pain is worse with range of motion He has pain with raising his had above his head.  His initial injury was a partial rotator cuff injury.  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 He is not exercising and is adherent to low salt diet.  Blood pressure is well controlled at home. Cardiac symptoms none. Patient denies chest pain, claudication, exertional chest pressure/discomfort and irregular heart beat.  Cardiovascular risk factors: hypertension and male gender. Use of agents associated with hypertension: none. History of target organ damage: none. BP Readings from Last 3 Encounters:  05/09/18 (!) 130/96  03/15/18 121/79  02/05/18 122/80     Patient Active Problem List   Diagnosis Date Noted  . Spinal stenosis of lumbar region with neurogenic claudication 03/15/2018  . Lumbar disc herniation 03/15/2018  . Other and unspecified hyperlipidemia 12/06/2012  . Essential hypertension 12/06/2012  . Atypical chest pain 07/13/2012  . GERD (gastroesophageal reflux disease) 12/15/2011    Past Medical History:  Diagnosis Date  . GERD (gastroesophageal reflux disease)   . Hyperlipidemia   . Hypertension     Current Outpatient Medications  Medication Sig Dispense Refill  . aspirin EC 81 MG tablet Take 1 tablet (81 mg total) by mouth daily. 100 tablet 3  . fluticasone (FLONASE) 50 MCG/ACT nasal spray Place 2 sprays into both nostrils daily. 1 g 11  . ibuprofen (ADVIL,MOTRIN) 800 MG tablet Take 1 tablet (800 mg total) by mouth 3 (three) times daily. (Patient taking differently: Take 800 mg by mouth every 8 (eight) hours as needed for headache or moderate pain. ) 21 tablet 0  .  lansoprazole (PREVACID) 30 MG capsule Take one capsule by mouth daily 30 MINUTES BEFORE A MEAL 30 capsule 11  . atorvastatin (LIPITOR) 40 MG tablet Take 1 tablet (40 mg total) by mouth daily. 90 tablet 1  . cetirizine (ZYRTEC) 10 MG tablet Take 1 tablet (10 mg total) by mouth daily. 30 tablet 11  . meloxicam (MOBIC) 15 MG tablet Take 1 tablet (15 mg total) by mouth daily.  Take with food. 30 tablet 5   No current facility-administered medications for this visit.     Allergies  Allergen Reactions  . Chloroquine Itching    Social History   Socioeconomic History  . Marital status: Single    Spouse name: Not on file  . Number of children: Not on file  . Years of education: Not on file  . Highest education level: Not on file  Occupational History  . Not on file  Social Needs  . Financial resource strain: Not on file  . Food insecurity:    Worry: Not on file    Inability: Not on file  . Transportation needs:    Medical: Not on file    Non-medical: Not on file  Tobacco Use  . Smoking status: Never Smoker  . Smokeless tobacco: Never Used  Substance and Sexual Activity  . Alcohol use: No  . Drug use: No  . Sexual activity: Not on file  Lifestyle  . Physical activity:    Days per week: Not on file    Minutes per session: Not on file  . Stress: Not on file  Relationships  . Social connections:    Talks on phone: Not on file    Gets together: Not on file    Attends religious service: Not on file    Active member of club or organization: Not on file    Attends meetings of clubs or organizations: Not on file    Relationship status: Not on file  . Intimate partner violence:    Fear of current or ex partner: Not on file    Emotionally abused: Not on file    Physically abused: Not on file    Forced sexual activity: Not on file  Other Topics Concern  . Not on file  Social History Narrative  . Not on file    ROS Review of Systems  Constitutional: Negative for activity change, appetite change, chills and fever.  HENT: Negative for congestion, nosebleeds, trouble swallowing and voice change.   Respiratory: Negative for cough, shortness of breath and wheezing.   Gastrointestinal: Negative for diarrhea, nausea and vomiting.  Genitourinary: Negative for difficulty urinating, dysuria, flank pain and hematuria.  Musculoskeletal: see hpi  Neurological: Negative for dizziness, speech difficulty, light-headedness and numbness.  See HPI. All other review of systems negative.   Objective   Vitals as reported by the patient: There were no vitals filed for this visit.  Diagnoses and all orders for this visit:  Essential hypertension- Patient's blood pressure is at goal of 139/89 or less. Condition is stable. Continue current medications and treatment plan. I recommend that you exercise for 30-45 minutes 5 days a week. I also recommend a balanced diet with fruits and vegetables every day, lean meats, and little fried foods. The DASH diet (you can find this online) is a good example of this.  -     Lipid panel -     Comprehensive metabolic panel -     TSH  Gastroesophageal reflux disease without esophagitis- stable, cpm  Nontraumatic incomplete tear of left rotator cuff- advised meloxicam He should see orthopedics after the pandemic -     meloxicam (MOBIC) 15 MG tablet; Take 1 tablet (15 mg total) by mouth daily. Take with food.  History of BCG vaccination -     DG Chest 2 View; Future  Screening-pulmonary TB CXR negative for pulmonary TB -     DG Chest 2 View; Future  Other orders -     Flu Vaccine QUAD 36+ mos IM -     atorvastatin (LIPITOR) 40 MG tablet; Take 1 tablet (40 mg total) by mouth daily. -     lansoprazole (PREVACID) 30 MG capsule; Take one capsule by mouth daily 30 MINUTES BEFORE A MEAL -     fluticasone (FLONASE) 50 MCG/ACT nasal spray; Place 2 sprays into both nostrils daily. -     cetirizine (ZYRTEC) 10 MG tablet; Take 1 tablet (10 mg total) by mouth daily.     I discussed the assessment and treatment plan with the patient. The patient was provided an opportunity to ask questions and all were answered. The patient agreed with the plan and demonstrated an understanding of the instructions.   The patient was advised to call back or seek an in-person evaluation if the symptoms worsen or if the  condition fails to improve as anticipated.  I provided 20 minutes of non-face-to-face time during this encounter.  Doristine Bosworth, MD  Primary Care at [facility]

## 2018-12-27 NOTE — Progress Notes (Signed)
CC: Establish care/med refills.  Pt c/o re-injury to left shoulder with severe pain, pain level 10/10, pt advises pain causes him to have fever and chills and is requesting oxycodone for pain as otc ibuprofen is not helping pain.  No travel outside the country or  in the past 3 weeks.

## 2018-12-28 ENCOUNTER — Telehealth: Payer: Self-pay | Admitting: Family Medicine

## 2018-12-28 LAB — LIPID PANEL
Chol/HDL Ratio: 2.5 ratio (ref 0.0–5.0)
Cholesterol, Total: 158 mg/dL (ref 100–199)
HDL: 64 mg/dL (ref >39)
LDL Calculated: 63 mg/dL (ref 0–99)
Triglycerides: 153 mg/dL — ABNORMAL HIGH (ref 0–149)
VLDL Cholesterol Cal: 31 mg/dL (ref 5–40)

## 2018-12-28 LAB — COMPREHENSIVE METABOLIC PANEL
ALBUMIN: 4.5 g/dL (ref 3.8–4.9)
ALT: 20 IU/L (ref 0–44)
AST: 18 IU/L (ref 0–40)
Albumin/Globulin Ratio: 1.9 (ref 1.2–2.2)
Alkaline Phosphatase: 72 IU/L (ref 39–117)
BUN/Creatinine Ratio: 9 (ref 9–20)
BUN: 11 mg/dL (ref 6–24)
Bilirubin Total: 0.2 mg/dL (ref 0.0–1.2)
CALCIUM: 9.3 mg/dL (ref 8.7–10.2)
CO2: 22 mmol/L (ref 20–29)
CREATININE: 1.26 mg/dL (ref 0.76–1.27)
Chloride: 102 mmol/L (ref 96–106)
GFR calc Af Amer: 75 mL/min/{1.73_m2} (ref >59)
GFR, EST NON AFRICAN AMERICAN: 65 mL/min/{1.73_m2} (ref >59)
GLOBULIN, TOTAL: 2.4 g/dL (ref 1.5–4.5)
Glucose: 126 mg/dL — ABNORMAL HIGH (ref 65–99)
Potassium: 4 mmol/L (ref 3.5–5.2)
SODIUM: 139 mmol/L (ref 134–144)
TOTAL PROTEIN: 6.9 g/dL (ref 6.0–8.5)

## 2018-12-28 LAB — TSH: TSH: 0.869 u[IU]/mL (ref 0.450–4.500)

## 2018-12-28 NOTE — Telephone Encounter (Signed)
Pt called stating that he had questions about medications; will attempt to contact pt.

## 2018-12-28 NOTE — Telephone Encounter (Signed)
Attempted to contact pt regarding his medication questions; left message on voicemail [phone]

## 2018-12-29 ENCOUNTER — Encounter: Payer: Self-pay | Admitting: Family Medicine

## 2019-01-04 ENCOUNTER — Telehealth: Payer: Self-pay | Admitting: Family Medicine

## 2019-01-04 NOTE — Telephone Encounter (Signed)
Copied from CRM [id number]. Topic: Quick Communication - See Telephone Encounter >> Jan 04, 2019 10:33 AM Jolayne Haines L wrote: CRM for notification. See Telephone encounter for: 01/04/19.  Patient said he did not pick up his Zyrtec & Flonase because it was too expensive. Patient is asking for something else to be called in.

## 2019-01-06 NOTE — Telephone Encounter (Signed)
Please send in a different rx for flonase and zyrtec as it is too expensive.

## 2019-04-13 IMAGING — DX CHEST - 2 VIEW
2 series · 2 of 2 positions shown · non-contrast
Comparison: 10/13/2016

CLINICAL DATA: tb screening, BCG as a child, +quantiferon screen,
+ppd screen

EXAM:
CHEST - 2 VIEW

[chest pa]
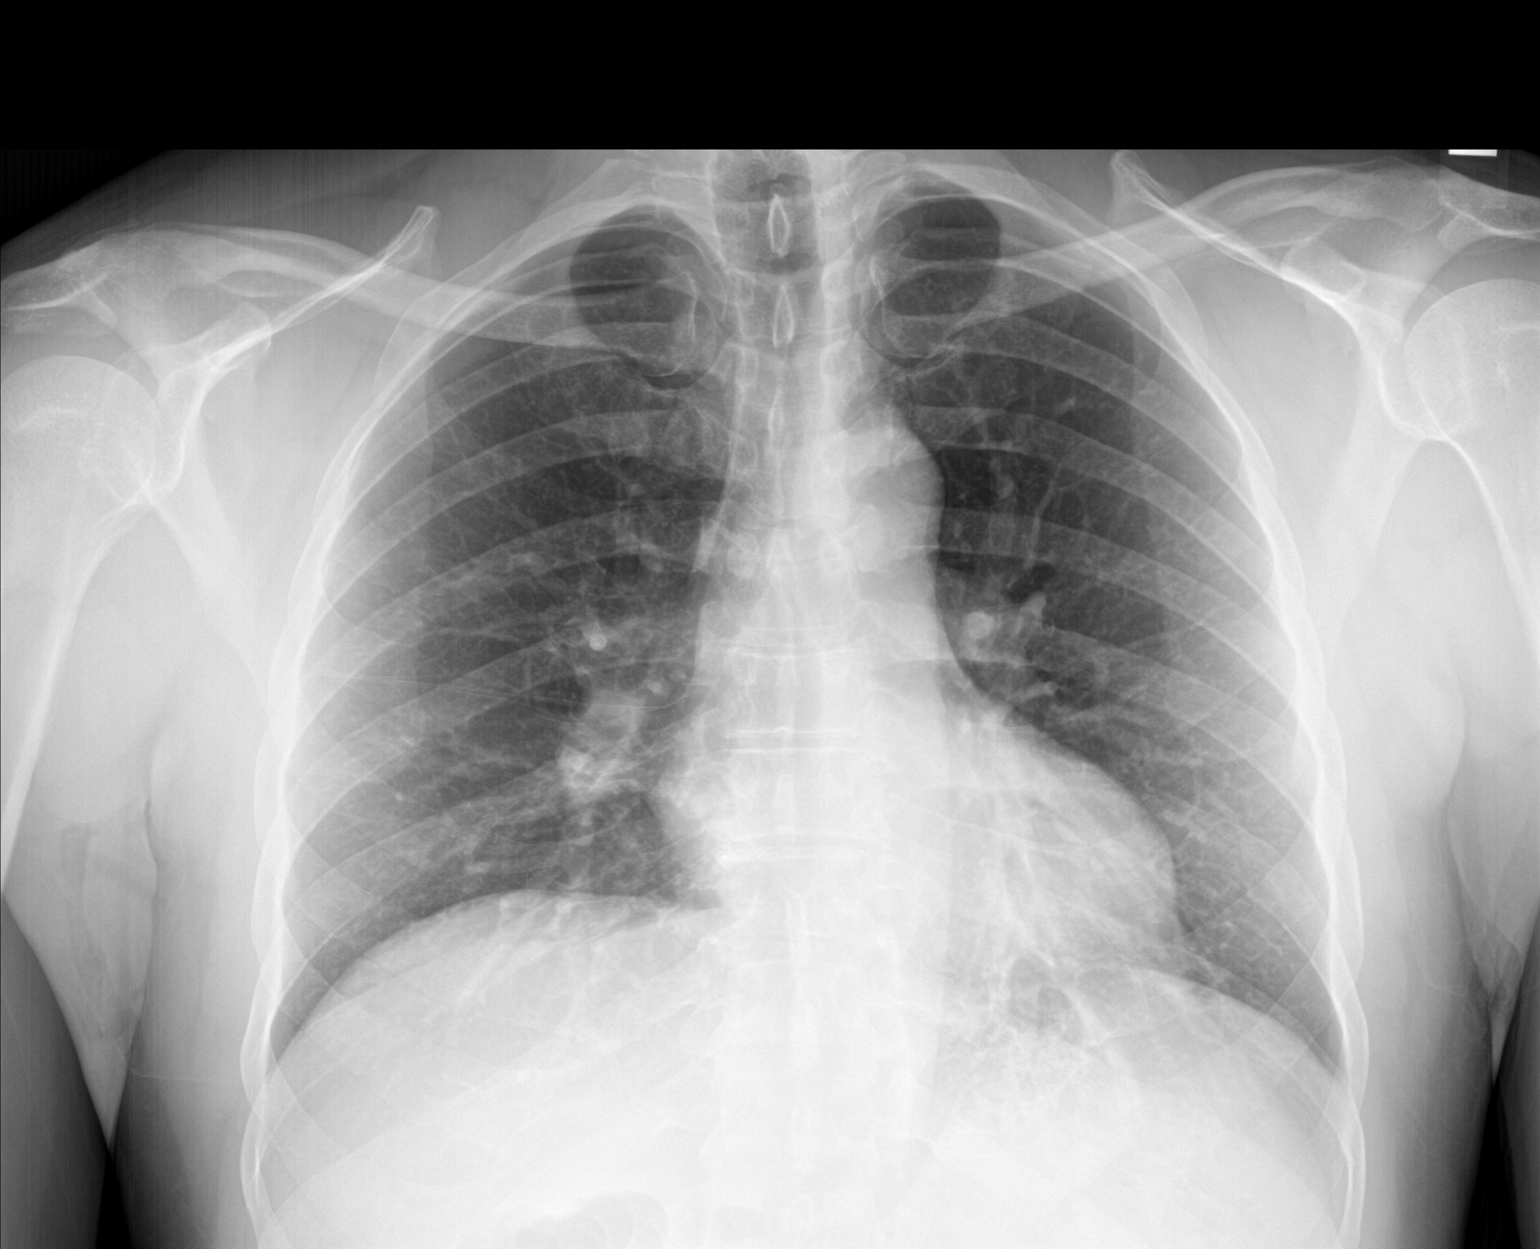

[chest lat]
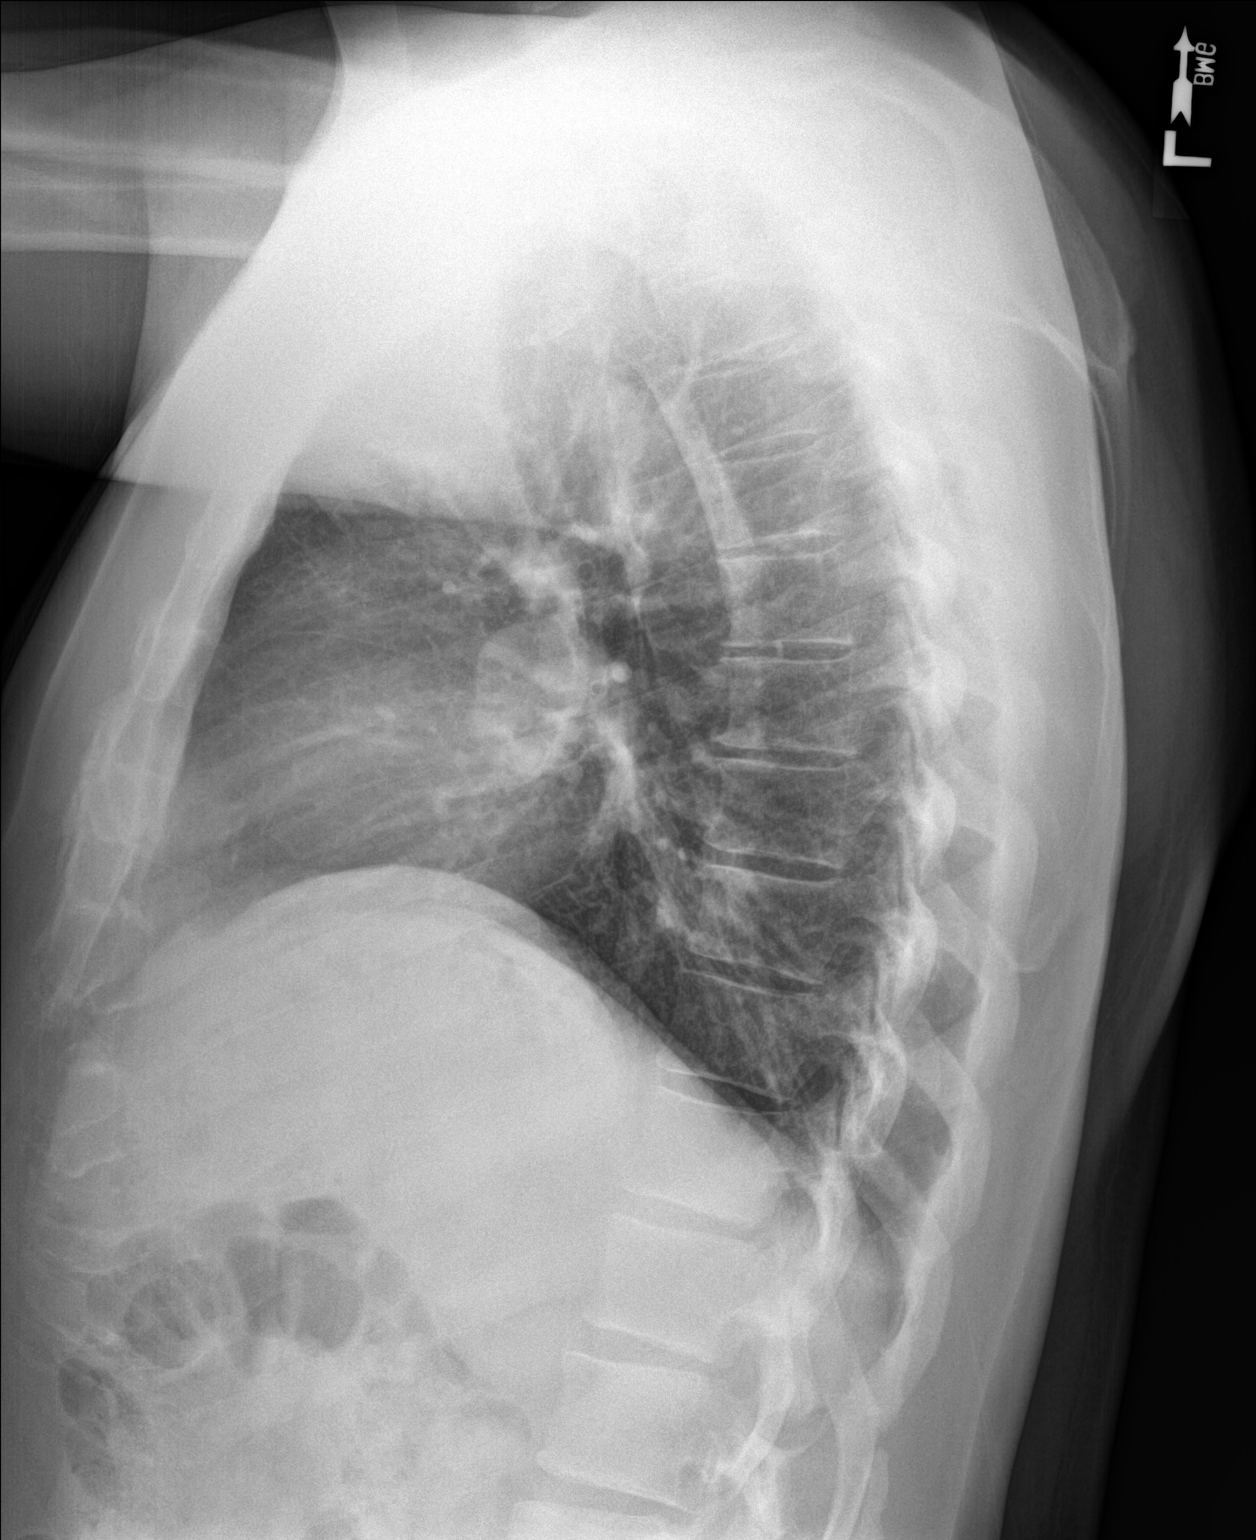

[2 of 2 positions shown; findings below may reference images not displayed]

FINDINGS: Cardiac silhouette is normal in size. No mediastinal or hilar
masses. There is no evidence of adenopathy.

Clear lungs.  No pleural effusion or pneumothorax.

Skeletal structures are intact.
IMPRESSION: No active cardiopulmonary disease.
